# Patient Record
Sex: Female | Born: 1935
Health system: Southern US, Community
[De-identification: ages and names within clinical notes are randomized; demographics above are authoritative.]

## PROBLEM LIST (undated history)

## (undated) ENCOUNTER — Emergency Department (HOSPITAL_COMMUNITY): Payer: Medicare PPO

## (undated) DIAGNOSIS — I2699 Other pulmonary embolism without acute cor pulmonale: Secondary | ICD-10-CM

## (undated) DIAGNOSIS — K5792 Diverticulitis of intestine, part unspecified, without perforation or abscess without bleeding: Secondary | ICD-10-CM

## (undated) DIAGNOSIS — I82409 Acute embolism and thrombosis of unspecified deep veins of unspecified lower extremity: Secondary | ICD-10-CM

## (undated) DIAGNOSIS — I10 Essential (primary) hypertension: Secondary | ICD-10-CM

## (undated) HISTORY — PX: EYE SURGERY: SHX253

## (undated) HISTORY — PX: TONSILLECTOMY: SUR1361

---

## 2022-05-23 ENCOUNTER — Inpatient Hospital Stay (HOSPITAL_BASED_OUTPATIENT_CLINIC_OR_DEPARTMENT_OTHER)
Admission: EM | Admit: 2022-05-23 | Discharge: 2022-06-06 | DRG: 329 | Disposition: A | Discharge status: Skilled Nursing Facility | Payer: Medicare PPO | Attending: Internal Medicine | Admitting: Internal Medicine

## 2022-05-23 ENCOUNTER — Emergency Department (HOSPITAL_BASED_OUTPATIENT_CLINIC_OR_DEPARTMENT_OTHER): Payer: Medicare PPO | Admitting: Radiology

## 2022-05-23 ENCOUNTER — Emergency Department (HOSPITAL_BASED_OUTPATIENT_CLINIC_OR_DEPARTMENT_OTHER): Payer: Medicare PPO

## 2022-05-23 ENCOUNTER — Other Ambulatory Visit: Payer: Self-pay

## 2022-05-23 ENCOUNTER — Encounter (HOSPITAL_BASED_OUTPATIENT_CLINIC_OR_DEPARTMENT_OTHER): Payer: Self-pay | Admitting: Emergency Medicine

## 2022-05-23 DIAGNOSIS — K6389 Other specified diseases of intestine: Secondary | ICD-10-CM | POA: Diagnosis not present

## 2022-05-23 DIAGNOSIS — I10 Essential (primary) hypertension: Secondary | ICD-10-CM | POA: Diagnosis not present

## 2022-05-23 DIAGNOSIS — F0390 Unspecified dementia without behavioral disturbance: Secondary | ICD-10-CM | POA: Diagnosis not present

## 2022-05-23 DIAGNOSIS — Z96641 Presence of right artificial hip joint: Secondary | ICD-10-CM | POA: Diagnosis present

## 2022-05-23 DIAGNOSIS — I82403 Acute embolism and thrombosis of unspecified deep veins of lower extremity, bilateral: Secondary | ICD-10-CM

## 2022-05-23 DIAGNOSIS — K567 Ileus, unspecified: Secondary | ICD-10-CM | POA: Diagnosis not present

## 2022-05-23 DIAGNOSIS — K9189 Other postprocedural complications and disorders of digestive system: Secondary | ICD-10-CM | POA: Diagnosis not present

## 2022-05-23 DIAGNOSIS — D62 Acute posthemorrhagic anemia: Secondary | ICD-10-CM | POA: Diagnosis not present

## 2022-05-23 DIAGNOSIS — I2693 Single subsegmental pulmonary embolism without acute cor pulmonale: Secondary | ICD-10-CM | POA: Diagnosis present

## 2022-05-23 DIAGNOSIS — E43 Unspecified severe protein-calorie malnutrition: Secondary | ICD-10-CM | POA: Insufficient documentation

## 2022-05-23 DIAGNOSIS — K5700 Diverticulitis of small intestine with perforation and abscess without bleeding: Secondary | ICD-10-CM | POA: Diagnosis not present

## 2022-05-23 DIAGNOSIS — R4189 Other symptoms and signs involving cognitive functions and awareness: Secondary | ICD-10-CM | POA: Diagnosis present

## 2022-05-23 DIAGNOSIS — I2699 Other pulmonary embolism without acute cor pulmonale: Secondary | ICD-10-CM | POA: Diagnosis present

## 2022-05-23 DIAGNOSIS — Z79899 Other long term (current) drug therapy: Secondary | ICD-10-CM

## 2022-05-23 DIAGNOSIS — I82451 Acute embolism and thrombosis of right peroneal vein: Secondary | ICD-10-CM | POA: Diagnosis present

## 2022-05-23 DIAGNOSIS — E876 Hypokalemia: Secondary | ICD-10-CM | POA: Diagnosis present

## 2022-05-23 DIAGNOSIS — G8929 Other chronic pain: Secondary | ICD-10-CM | POA: Diagnosis present

## 2022-05-23 DIAGNOSIS — I82432 Acute embolism and thrombosis of left popliteal vein: Secondary | ICD-10-CM | POA: Diagnosis present

## 2022-05-23 DIAGNOSIS — J189 Pneumonia, unspecified organism: Secondary | ICD-10-CM | POA: Diagnosis not present

## 2022-05-23 DIAGNOSIS — Z66 Do not resuscitate: Secondary | ICD-10-CM | POA: Diagnosis not present

## 2022-05-23 DIAGNOSIS — I82462 Acute embolism and thrombosis of left calf muscular vein: Secondary | ICD-10-CM | POA: Diagnosis present

## 2022-05-23 DIAGNOSIS — R319 Hematuria, unspecified: Secondary | ICD-10-CM | POA: Diagnosis not present

## 2022-05-23 DIAGNOSIS — R1909 Other intra-abdominal and pelvic swelling, mass and lump: Secondary | ICD-10-CM | POA: Diagnosis not present

## 2022-05-23 HISTORY — DX: Diverticulitis of intestine, part unspecified, without perforation or abscess without bleeding: K57.92

## 2022-05-23 HISTORY — DX: Essential (primary) hypertension: I10

## 2022-05-23 LAB — COMPREHENSIVE METABOLIC PANEL
ALT: 16 U/L (ref 0–44)
AST: 20 U/L (ref 15–41)
Albumin: 3.2 g/dL — ABNORMAL LOW (ref 3.5–5.0)
Alkaline Phosphatase: 35 U/L — ABNORMAL LOW (ref 38–126)
Anion gap: 13 (ref 5–15)
BUN: 25 mg/dL — ABNORMAL HIGH (ref 8–23)
CO2: 25 mmol/L (ref 22–32)
Calcium: 9.2 mg/dL (ref 8.9–10.3)
Chloride: 103 mmol/L (ref 98–111)
Creatinine, Ser: 0.96 mg/dL (ref 0.44–1.00)
GFR, Estimated: 56 mL/min — ABNORMAL LOW (ref >60)
Glucose, Bld: 141 mg/dL — ABNORMAL HIGH (ref 70–99)
Potassium: 3.6 mmol/L (ref 3.5–5.1)
Sodium: 141 mmol/L (ref 135–145)
Total Bilirubin: 0.5 mg/dL (ref 0.3–1.2)
Total Protein: 5.8 g/dL — ABNORMAL LOW (ref 6.5–8.1)

## 2022-05-23 LAB — CBC WITH DIFFERENTIAL/PLATELET
Abs Immature Granulocytes: 0.04 10*3/uL (ref 0.00–0.07)
Basophils Absolute: 0 10*3/uL (ref 0.0–0.1)
Basophils Relative: 0 %
Eosinophils Absolute: 0.1 10*3/uL (ref 0.0–0.5)
Eosinophils Relative: 1 %
HCT: 41.2 % (ref 36.0–46.0)
Hemoglobin: 13.4 g/dL (ref 12.0–15.0)
Immature Granulocytes: 0 %
Lymphocytes Relative: 14 %
Lymphs Abs: 1.6 10*3/uL (ref 0.7–4.0)
MCH: 30.5 pg (ref 26.0–34.0)
MCHC: 32.5 g/dL (ref 30.0–36.0)
MCV: 93.6 fL (ref 80.0–100.0)
Monocytes Absolute: 1 10*3/uL (ref 0.1–1.0)
Monocytes Relative: 9 %
Neutro Abs: 8.6 10*3/uL — ABNORMAL HIGH (ref 1.7–7.7)
Neutrophils Relative %: 76 %
Platelets: 270 10*3/uL (ref 150–400)
RBC: 4.4 MIL/uL (ref 3.87–5.11)
RDW: 13.5 % (ref 11.5–15.5)
WBC: 11.3 10*3/uL — ABNORMAL HIGH (ref 4.0–10.5)
nRBC: 0 % (ref 0.0–0.2)

## 2022-05-23 LAB — URINALYSIS, ROUTINE W REFLEX MICROSCOPIC
Bilirubin Urine: NEGATIVE
Glucose, UA: NEGATIVE mg/dL
Hgb urine dipstick: NEGATIVE
Nitrite: NEGATIVE
Protein, ur: 30 mg/dL — AB
Specific Gravity, Urine: 1.023 (ref 1.005–1.030)
pH: 6 (ref 5.0–8.0)

## 2022-05-23 LAB — LACTIC ACID, PLASMA: Lactic Acid, Venous: 1.8 mmol/L (ref 0.5–1.9)

## 2022-05-23 LAB — TROPONIN I (HIGH SENSITIVITY)
Troponin I (High Sensitivity): 10 ng/L (ref <18)
Troponin I (High Sensitivity): 10 ng/L (ref <18)

## 2022-05-23 LAB — PROTIME-INR
INR: 1.3 — ABNORMAL HIGH (ref 0.8–1.2)
Prothrombin Time: 15.6 seconds — ABNORMAL HIGH (ref 11.4–15.2)

## 2022-05-23 MED ORDER — LIDOCAINE VISCOUS HCL 2 % MT SOLN
15.0000 mL | Freq: Once | OROMUCOSAL | Status: AC
  Administered 2022-05-23: 15 mL via ORAL
  Filled 2022-05-23: qty 15

## 2022-05-23 MED ORDER — IOHEXOL 350 MG/ML SOLN
100.0000 mL | Freq: Once | INTRAVENOUS | Status: AC | PRN
  Administered 2022-05-23: 75 mL via INTRAVENOUS

## 2022-05-23 MED ORDER — ALUM & MAG HYDROXIDE-SIMETH 200-200-20 MG/5ML PO SUSP
30.0000 mL | Freq: Once | ORAL | Status: AC
  Administered 2022-05-23: 30 mL via ORAL
  Filled 2022-05-23: qty 30

## 2022-05-23 MED ORDER — HEPARIN BOLUS VIA INFUSION
3000.0000 [IU] | Freq: Once | INTRAVENOUS | Status: AC
Start: 2022-05-23 — End: 2022-05-23
  Administered 2022-05-23: 3000 [IU] via INTRAVENOUS

## 2022-05-23 MED ORDER — HEPARIN (PORCINE) 25000 UT/250ML-% IV SOLN
800.0000 [IU]/h | INTRAVENOUS | Status: DC
  Administered 2022-05-23: 1000 [IU]/h via INTRAVENOUS
  Administered 2022-05-24: 850 [IU]/h via INTRAVENOUS
  Filled 2022-05-23 (×2): qty 250

## 2022-05-23 MED ORDER — FAMOTIDINE IN NACL 20-0.9 MG/50ML-% IV SOLN
20.0000 mg | Freq: Once | INTRAVENOUS | Status: AC
  Administered 2022-05-23: 20 mg via INTRAVENOUS
  Filled 2022-05-23: qty 50

## 2022-05-24 ENCOUNTER — Encounter (HOSPITAL_COMMUNITY): Payer: Self-pay | Admitting: Family Medicine

## 2022-05-24 ENCOUNTER — Inpatient Hospital Stay (HOSPITAL_COMMUNITY): Payer: Medicare PPO

## 2022-05-24 ENCOUNTER — Other Ambulatory Visit (HOSPITAL_COMMUNITY): Payer: Self-pay

## 2022-05-24 DIAGNOSIS — K567 Ileus, unspecified: Secondary | ICD-10-CM | POA: Diagnosis not present

## 2022-05-24 DIAGNOSIS — R1909 Other intra-abdominal and pelvic swelling, mass and lump: Secondary | ICD-10-CM | POA: Diagnosis present

## 2022-05-24 DIAGNOSIS — K5700 Diverticulitis of small intestine with perforation and abscess without bleeding: Secondary | ICD-10-CM | POA: Diagnosis present

## 2022-05-24 DIAGNOSIS — Z79899 Other long term (current) drug therapy: Secondary | ICD-10-CM | POA: Diagnosis not present

## 2022-05-24 DIAGNOSIS — E43 Unspecified severe protein-calorie malnutrition: Secondary | ICD-10-CM | POA: Diagnosis present

## 2022-05-24 DIAGNOSIS — Z96641 Presence of right artificial hip joint: Secondary | ICD-10-CM | POA: Diagnosis present

## 2022-05-24 DIAGNOSIS — I10 Essential (primary) hypertension: Secondary | ICD-10-CM | POA: Diagnosis present

## 2022-05-24 DIAGNOSIS — I82462 Acute embolism and thrombosis of left calf muscular vein: Secondary | ICD-10-CM | POA: Diagnosis present

## 2022-05-24 DIAGNOSIS — Z66 Do not resuscitate: Secondary | ICD-10-CM | POA: Diagnosis not present

## 2022-05-24 DIAGNOSIS — F0390 Unspecified dementia without behavioral disturbance: Secondary | ICD-10-CM | POA: Diagnosis present

## 2022-05-24 DIAGNOSIS — E876 Hypokalemia: Secondary | ICD-10-CM | POA: Diagnosis present

## 2022-05-24 DIAGNOSIS — I2699 Other pulmonary embolism without acute cor pulmonale: Secondary | ICD-10-CM | POA: Diagnosis not present

## 2022-05-24 DIAGNOSIS — K6389 Other specified diseases of intestine: Secondary | ICD-10-CM | POA: Diagnosis present

## 2022-05-24 DIAGNOSIS — Z515 Encounter for palliative care: Secondary | ICD-10-CM | POA: Diagnosis not present

## 2022-05-24 DIAGNOSIS — D649 Anemia, unspecified: Secondary | ICD-10-CM | POA: Diagnosis not present

## 2022-05-24 DIAGNOSIS — I82432 Acute embolism and thrombosis of left popliteal vein: Secondary | ICD-10-CM | POA: Diagnosis present

## 2022-05-24 DIAGNOSIS — R319 Hematuria, unspecified: Secondary | ICD-10-CM | POA: Diagnosis not present

## 2022-05-24 DIAGNOSIS — I2693 Single subsegmental pulmonary embolism without acute cor pulmonale: Secondary | ICD-10-CM | POA: Diagnosis present

## 2022-05-24 DIAGNOSIS — K9189 Other postprocedural complications and disorders of digestive system: Secondary | ICD-10-CM | POA: Diagnosis not present

## 2022-05-24 DIAGNOSIS — I82403 Acute embolism and thrombosis of unspecified deep veins of lower extremity, bilateral: Secondary | ICD-10-CM

## 2022-05-24 DIAGNOSIS — D62 Acute posthemorrhagic anemia: Secondary | ICD-10-CM | POA: Diagnosis not present

## 2022-05-24 DIAGNOSIS — I82451 Acute embolism and thrombosis of right peroneal vein: Secondary | ICD-10-CM | POA: Diagnosis present

## 2022-05-24 DIAGNOSIS — R4189 Other symptoms and signs involving cognitive functions and awareness: Secondary | ICD-10-CM | POA: Diagnosis present

## 2022-05-24 DIAGNOSIS — J189 Pneumonia, unspecified organism: Secondary | ICD-10-CM | POA: Diagnosis not present

## 2022-05-24 DIAGNOSIS — G8929 Other chronic pain: Secondary | ICD-10-CM | POA: Diagnosis present

## 2022-05-24 DIAGNOSIS — Z7189 Other specified counseling: Secondary | ICD-10-CM | POA: Diagnosis not present

## 2022-05-24 LAB — BASIC METABOLIC PANEL
Anion gap: 10 (ref 5–15)
BUN: 18 mg/dL (ref 8–23)
CO2: 24 mmol/L (ref 22–32)
Calcium: 8.3 mg/dL — ABNORMAL LOW (ref 8.9–10.3)
Chloride: 105 mmol/L (ref 98–111)
Creatinine, Ser: 0.93 mg/dL (ref 0.44–1.00)
GFR, Estimated: 58 mL/min — ABNORMAL LOW (ref >60)
Glucose, Bld: 98 mg/dL (ref 70–99)
Potassium: 3.3 mmol/L — ABNORMAL LOW (ref 3.5–5.1)
Sodium: 139 mmol/L (ref 135–145)

## 2022-05-24 LAB — MAGNESIUM: Magnesium: 2 mg/dL (ref 1.7–2.4)

## 2022-05-24 LAB — CBC
HCT: 34.8 % — ABNORMAL LOW (ref 36.0–46.0)
Hemoglobin: 11.6 g/dL — ABNORMAL LOW (ref 12.0–15.0)
MCH: 31.3 pg (ref 26.0–34.0)
MCHC: 33.3 g/dL (ref 30.0–36.0)
MCV: 93.8 fL (ref 80.0–100.0)
Platelets: 228 10*3/uL (ref 150–400)
RBC: 3.71 MIL/uL — ABNORMAL LOW (ref 3.87–5.11)
RDW: 13.5 % (ref 11.5–15.5)
WBC: 10.2 10*3/uL (ref 4.0–10.5)
nRBC: 0 % (ref 0.0–0.2)

## 2022-05-24 LAB — ECHOCARDIOGRAM COMPLETE
AR max vel: 2.78 cm2
AV Area VTI: 3.06 cm2
AV Area mean vel: 2.43 cm2
AV Mean grad: 2 mmHg
AV Peak grad: 5.1 mmHg
Ao pk vel: 1.13 m/s
Area-P 1/2: 2.61 cm2
Height: 63.5 in
S' Lateral: 3.6 cm
Weight: 2048 oz

## 2022-05-24 LAB — HEPARIN LEVEL (UNFRACTIONATED)
Heparin Unfractionated: 0.68 IU/mL (ref 0.30–0.70)
Heparin Unfractionated: 0.85 IU/mL — ABNORMAL HIGH (ref 0.30–0.70)
Heparin Unfractionated: 0.7 IU/mL (ref 0.30–0.70)

## 2022-05-24 LAB — BRAIN NATRIURETIC PEPTIDE: B Natriuretic Peptide: 190.6 pg/mL — ABNORMAL HIGH (ref 0.0–100.0)

## 2022-05-24 LAB — LACTIC ACID, PLASMA: Lactic Acid, Venous: 1.2 mmol/L (ref 0.5–1.9)

## 2022-05-24 MED ORDER — POTASSIUM CHLORIDE CRYS ER 10 MEQ PO TBCR
40.0000 meq | EXTENDED_RELEASE_TABLET | Freq: Once | ORAL | Status: AC
  Administered 2022-05-24: 40 meq via ORAL
  Filled 2022-05-24: qty 4

## 2022-05-24 MED ORDER — PANTOPRAZOLE SODIUM 40 MG PO TBEC: 40.0000 mg | DELAYED_RELEASE_TABLET | Freq: Every day | ORAL | Status: DC

## 2022-05-24 MED ORDER — LORATADINE 10 MG PO TABS: 10.0000 mg | ORAL_TABLET | Freq: Every day | ORAL | Status: DC

## 2022-05-24 MED ORDER — SACCHAROMYCES BOULARDII 250 MG PO CAPS
250.0000 mg | ORAL_CAPSULE | Freq: Two times a day (BID) | ORAL | Status: DC
  Administered 2022-05-24: 250 mg via ORAL
  Filled 2022-05-24: qty 1

## 2022-05-24 MED ORDER — COENZYME Q10 30 MG PO CAPS: 30.0000 mg | ORAL_CAPSULE | Freq: Every day | ORAL | Status: DC

## 2022-05-24 MED ORDER — FENTANYL CITRATE PF 50 MCG/ML IJ SOSY
12.5000 ug | PREFILLED_SYRINGE | INTRAMUSCULAR | Status: DC | PRN
  Administered 2022-05-24 – 2022-05-25 (×2): 12.5 ug via INTRAVENOUS
  Filled 2022-05-24 (×2): qty 1

## 2022-05-24 MED ORDER — ACETAMINOPHEN 325 MG PO TABS
650.0000 mg | ORAL_TABLET | Freq: Four times a day (QID) | ORAL | Status: DC | PRN
Start: 2022-05-24 — End: 2022-05-25

## 2022-05-24 MED ORDER — SODIUM CHLORIDE 0.9% FLUSH
3.0000 mL | Freq: Two times a day (BID) | INTRAVENOUS | Status: DC
  Administered 2022-05-25 – 2022-06-06 (×18): 3 mL via INTRAVENOUS

## 2022-05-24 MED ORDER — ATENOLOL 25 MG PO TABS
25.0000 mg | ORAL_TABLET | Freq: Every day | ORAL | Status: DC
  Administered 2022-05-24 – 2022-05-25 (×2): 25 mg via ORAL
  Filled 2022-05-24 (×2): qty 1

## 2022-05-24 MED ORDER — HYDRALAZINE HCL 25 MG PO TABS: 25.0000 mg | ORAL_TABLET | Freq: Four times a day (QID) | ORAL | Status: DC | PRN

## 2022-05-24 MED ORDER — PANTOPRAZOLE SODIUM 40 MG PO TBEC
40.0000 mg | DELAYED_RELEASE_TABLET | Freq: Every day | ORAL | Status: DC
  Administered 2022-05-24 – 2022-05-25 (×2): 40 mg via ORAL
  Filled 2022-05-24 (×2): qty 1

## 2022-05-24 MED ORDER — MELATONIN 3 MG PO TABS
3.0000 mg | ORAL_TABLET | Freq: Every evening | ORAL | Status: DC | PRN
  Administered 2022-05-24: 3 mg via ORAL
  Filled 2022-05-24: qty 1

## 2022-05-24 MED ORDER — IPRATROPIUM BROMIDE 0.06 % NA SOLN
2.0000 | Freq: Two times a day (BID) | NASAL | Status: DC | PRN
Start: 2022-05-24 — End: 2022-06-06

## 2022-05-24 MED ORDER — SODIUM CHLORIDE 0.9 % IV SOLN: INTRAVENOUS | Status: AC

## 2022-05-24 MED ORDER — PIPERACILLIN-TAZOBACTAM 3.375 G IVPB
3.3750 g | Freq: Three times a day (TID) | INTRAVENOUS | Status: DC
  Administered 2022-05-24 – 2022-05-25 (×5): 3.375 g via INTRAVENOUS
  Filled 2022-05-24 (×5): qty 50

## 2022-05-24 MED ORDER — OMEGA-3-ACID ETHYL ESTERS 1 G PO CAPS: 1.0000 | ORAL_CAPSULE | Freq: Every day | ORAL | Status: DC

## 2022-05-24 MED ORDER — ALUM & MAG HYDROXIDE-SIMETH 200-200-20 MG/5ML PO SUSP
30.0000 mL | Freq: Four times a day (QID) | ORAL | Status: DC | PRN
  Administered 2022-05-24 – 2022-05-25 (×2): 30 mL via ORAL
  Filled 2022-05-24 (×2): qty 30

## 2022-05-24 MED ORDER — FAMOTIDINE 20 MG PO TABS: 20.0000 mg | ORAL_TABLET | Freq: Every day | ORAL | Status: DC

## 2022-05-24 MED ORDER — LIDOCAINE VISCOUS HCL 2 % MT SOLN
15.0000 mL | Freq: Four times a day (QID) | OROMUCOSAL | Status: DC | PRN
  Administered 2022-05-24: 15 mL via ORAL
  Filled 2022-05-24 (×2): qty 15

## 2022-05-24 MED ORDER — FAMOTIDINE 20 MG PO TABS: 20.0000 mg | ORAL_TABLET | Freq: Two times a day (BID) | ORAL | Status: DC

## 2022-05-25 ENCOUNTER — Encounter (HOSPITAL_COMMUNITY)
Admission: EM | Disposition: A | Discharge status: Skilled Nursing Facility | Payer: Self-pay | Source: Home / Self Care | Attending: Family Medicine

## 2022-05-25 ENCOUNTER — Inpatient Hospital Stay (HOSPITAL_COMMUNITY): Payer: Medicare PPO | Admitting: Certified Registered"

## 2022-05-25 ENCOUNTER — Other Ambulatory Visit: Payer: Self-pay

## 2022-05-25 ENCOUNTER — Encounter (HOSPITAL_COMMUNITY): Payer: Self-pay | Admitting: Family Medicine

## 2022-05-25 DIAGNOSIS — Z7189 Other specified counseling: Secondary | ICD-10-CM

## 2022-05-25 DIAGNOSIS — Z515 Encounter for palliative care: Secondary | ICD-10-CM

## 2022-05-25 DIAGNOSIS — K5700 Diverticulitis of small intestine with perforation and abscess without bleeding: Secondary | ICD-10-CM

## 2022-05-25 DIAGNOSIS — D649 Anemia, unspecified: Secondary | ICD-10-CM

## 2022-05-25 DIAGNOSIS — I10 Essential (primary) hypertension: Secondary | ICD-10-CM

## 2022-05-25 DIAGNOSIS — F0390 Unspecified dementia without behavioral disturbance: Secondary | ICD-10-CM

## 2022-05-25 HISTORY — PX: LAPAROTOMY: SHX154

## 2022-05-25 HISTORY — PX: BOWEL RESECTION: SHX1257

## 2022-05-25 LAB — ABO/RH: ABO/RH(D): A POS

## 2022-05-25 LAB — BASIC METABOLIC PANEL
Anion gap: 8 (ref 5–15)
BUN: 11 mg/dL (ref 8–23)
CO2: 24 mmol/L (ref 22–32)
Calcium: 8.3 mg/dL — ABNORMAL LOW (ref 8.9–10.3)
Chloride: 107 mmol/L (ref 98–111)
Creatinine, Ser: 1.08 mg/dL — ABNORMAL HIGH (ref 0.44–1.00)
GFR, Estimated: 49 mL/min — ABNORMAL LOW (ref >60)
Glucose, Bld: 100 mg/dL — ABNORMAL HIGH (ref 70–99)
Potassium: 3.5 mmol/L (ref 3.5–5.1)
Sodium: 139 mmol/L (ref 135–145)

## 2022-05-25 LAB — TYPE AND SCREEN
ABO/RH(D): A POS
Antibody Screen: NEGATIVE

## 2022-05-25 LAB — CBC
HCT: 34.4 % — ABNORMAL LOW (ref 36.0–46.0)
Hemoglobin: 11.8 g/dL — ABNORMAL LOW (ref 12.0–15.0)
MCH: 31.6 pg (ref 26.0–34.0)
MCHC: 34.3 g/dL (ref 30.0–36.0)
MCV: 92.2 fL (ref 80.0–100.0)
Platelets: 229 10*3/uL (ref 150–400)
RBC: 3.73 MIL/uL — ABNORMAL LOW (ref 3.87–5.11)
RDW: 13.5 % (ref 11.5–15.5)
WBC: 9.6 10*3/uL (ref 4.0–10.5)
nRBC: 0 % (ref 0.0–0.2)

## 2022-05-25 LAB — HEPARIN LEVEL (UNFRACTIONATED): Heparin Unfractionated: 0.51 IU/mL (ref 0.30–0.70)

## 2022-05-25 LAB — SURGICAL PCR SCREEN
MRSA, PCR: NEGATIVE
Staphylococcus aureus: NEGATIVE

## 2022-05-25 SURGERY — LAPAROTOMY, EXPLORATORY
Anesthesia: General | Site: Abdomen

## 2022-05-25 MED ORDER — ALUM & MAG HYDROXIDE-SIMETH 200-200-20 MG/5ML PO SUSP
30.0000 mL | Freq: Four times a day (QID) | ORAL | Status: DC | PRN
  Administered 2022-06-05 – 2022-06-06 (×2): 30 mL
  Filled 2022-05-25 (×2): qty 30

## 2022-05-25 MED ORDER — BUPIVACAINE LIPOSOME 1.3 % IJ SUSP
INTRAMUSCULAR | Status: AC
  Filled 2022-05-25: qty 20

## 2022-05-25 MED ORDER — LACTATED RINGERS IV SOLN: INTRAVENOUS | Status: DC

## 2022-05-25 MED ORDER — ACETAMINOPHEN 10 MG/ML IV SOLN
1000.0000 mg | Freq: Four times a day (QID) | INTRAVENOUS | Status: AC
  Administered 2022-05-25 – 2022-05-26 (×2): 1000 mg via INTRAVENOUS
  Filled 2022-05-25 (×3): qty 100

## 2022-05-25 MED ORDER — ACETAMINOPHEN 10 MG/ML IV SOLN
INTRAVENOUS | Status: AC
  Administered 2022-05-25: 1000 mg via INTRAVENOUS
  Filled 2022-05-25: qty 100

## 2022-05-25 MED ORDER — PROPOFOL 10 MG/ML IV BOLUS
INTRAVENOUS | Status: DC | PRN
  Administered 2022-05-25: 80 mg via INTRAVENOUS

## 2022-05-25 MED ORDER — PIPERACILLIN-TAZOBACTAM 3.375 G IVPB
3.3750 g | Freq: Three times a day (TID) | INTRAVENOUS | Status: AC
  Administered 2022-05-25 – 2022-05-26 (×2): 3.375 g via INTRAVENOUS
  Filled 2022-05-25 (×2): qty 50

## 2022-05-25 MED ORDER — ALBUMIN HUMAN 5 % IV SOLN: 12.5000 g | Freq: Once | INTRAVENOUS | Status: AC

## 2022-05-25 MED ORDER — HYDRALAZINE HCL 20 MG/ML IJ SOLN
INTRAMUSCULAR | Status: DC | PRN
  Administered 2022-05-25: 5 mg via INTRAVENOUS

## 2022-05-25 MED ORDER — FENTANYL CITRATE (PF) 250 MCG/5ML IJ SOLN
INTRAMUSCULAR | Status: DC | PRN
  Administered 2022-05-25: 100 ug via INTRAVENOUS
  Administered 2022-05-25: 25 ug via INTRAVENOUS
  Administered 2022-05-25: 50 ug via INTRAVENOUS

## 2022-05-25 MED ORDER — ORAL CARE MOUTH RINSE: 15.0000 mL | Freq: Once | OROMUCOSAL | Status: AC

## 2022-05-25 MED ORDER — ROCURONIUM BROMIDE 10 MG/ML (PF) SYRINGE
PREFILLED_SYRINGE | INTRAVENOUS | Status: DC | PRN
  Administered 2022-05-25: 100 mg via INTRAVENOUS

## 2022-05-25 MED ORDER — OMEGA-3-ACID ETHYL ESTERS 1 G PO CAPS
1.0000 | ORAL_CAPSULE | Freq: Every day | ORAL | Status: DC
  Administered 2022-05-28: 1 g
  Filled 2022-05-25 (×5): qty 1

## 2022-05-25 MED ORDER — FAMOTIDINE 20 MG PO TABS
20.0000 mg | ORAL_TABLET | Freq: Every day | ORAL | Status: DC
Start: 2022-05-26 — End: 2022-05-30
  Administered 2022-05-26 – 2022-05-29 (×4): 20 mg
  Filled 2022-05-25 (×5): qty 1

## 2022-05-25 MED ORDER — AMISULPRIDE (ANTIEMETIC) 5 MG/2ML IV SOLN: 10.0000 mg | Freq: Once | INTRAVENOUS | Status: DC | PRN

## 2022-05-25 MED ORDER — ATENOLOL 25 MG PO TABS
25.0000 mg | ORAL_TABLET | Freq: Every day | ORAL | Status: DC
  Administered 2022-05-26 – 2022-05-29 (×4): 25 mg
  Filled 2022-05-25 (×4): qty 1

## 2022-05-25 MED ORDER — LACTATED RINGERS IV SOLN: INTRAVENOUS | Status: DC | PRN

## 2022-05-25 MED ORDER — FENTANYL CITRATE (PF) 250 MCG/5ML IJ SOLN
INTRAMUSCULAR | Status: AC
  Filled 2022-05-25: qty 5

## 2022-05-25 MED ORDER — BUPIVACAINE LIPOSOME 1.3 % IJ SUSP
INTRAMUSCULAR | Status: DC | PRN
  Administered 2022-05-25: 20 mL

## 2022-05-25 MED ORDER — FENTANYL CITRATE (PF) 100 MCG/2ML IJ SOLN
INTRAMUSCULAR | Status: AC
  Administered 2022-05-25: 25 ug via INTRAVENOUS
  Filled 2022-05-25: qty 2

## 2022-05-25 MED ORDER — FENTANYL CITRATE PF 50 MCG/ML IJ SOSY
12.5000 ug | PREFILLED_SYRINGE | INTRAMUSCULAR | Status: DC | PRN
  Administered 2022-05-25 – 2022-05-26 (×2): 12.5 ug via INTRAVENOUS
  Filled 2022-05-25 (×2): qty 1

## 2022-05-25 MED ORDER — HEPARIN (PORCINE) 25000 UT/250ML-% IV SOLN
800.0000 [IU]/h | INTRAVENOUS | Status: DC
  Administered 2022-05-26 – 2022-05-28 (×3): 800 [IU]/h via INTRAVENOUS
  Filled 2022-05-25 (×4): qty 250

## 2022-05-25 MED ORDER — ALBUMIN HUMAN 5 % IV SOLN
INTRAVENOUS | Status: AC
  Administered 2022-05-25: 12.5 g via INTRAVENOUS
  Filled 2022-05-25: qty 250

## 2022-05-25 MED ORDER — OXYCODONE HCL 5 MG/5ML PO SOLN: 5.0000 mg | Freq: Once | ORAL | Status: DC | PRN

## 2022-05-25 MED ORDER — ONDANSETRON HCL 4 MG/2ML IJ SOLN: 4.0000 mg | Freq: Once | INTRAMUSCULAR | Status: DC | PRN

## 2022-05-25 MED ORDER — LIDOCAINE VISCOUS HCL 2 % MT SOLN: 15.0000 mL | Freq: Four times a day (QID) | OROMUCOSAL | Status: DC | PRN

## 2022-05-25 MED ORDER — PROPOFOL 10 MG/ML IV BOLUS
INTRAVENOUS | Status: AC
  Filled 2022-05-25: qty 20

## 2022-05-25 MED ORDER — FENTANYL CITRATE (PF) 100 MCG/2ML IJ SOLN
25.0000 ug | INTRAMUSCULAR | Status: DC | PRN
  Administered 2022-05-25 (×3): 25 ug via INTRAVENOUS

## 2022-05-25 MED ORDER — PHENYLEPHRINE HCL-NACL 20-0.9 MG/250ML-% IV SOLN
INTRAVENOUS | Status: DC | PRN
  Administered 2022-05-25: 15 ug/min via INTRAVENOUS

## 2022-05-25 MED ORDER — 0.9 % SODIUM CHLORIDE (POUR BTL) OPTIME
TOPICAL | Status: DC | PRN
  Administered 2022-05-25: 1000 mL

## 2022-05-25 MED ORDER — OXYCODONE HCL 5 MG PO TABS: 5.0000 mg | ORAL_TABLET | Freq: Once | ORAL | Status: DC | PRN

## 2022-05-25 MED ORDER — CHLORHEXIDINE GLUCONATE 0.12 % MT SOLN: 15.0000 mL | Freq: Once | OROMUCOSAL | Status: AC

## 2022-05-25 MED ORDER — ONDANSETRON HCL 4 MG/2ML IJ SOLN
INTRAMUSCULAR | Status: AC
  Filled 2022-05-25: qty 2

## 2022-05-25 MED ORDER — PANTOPRAZOLE SODIUM 40 MG IV SOLR
40.0000 mg | Freq: Every day | INTRAVENOUS | Status: DC
  Administered 2022-05-26 – 2022-05-30 (×5): 40 mg via INTRAVENOUS
  Filled 2022-05-25 (×6): qty 10

## 2022-05-25 MED ORDER — HYDRALAZINE HCL 25 MG PO TABS: 25.0000 mg | ORAL_TABLET | Freq: Four times a day (QID) | ORAL | Status: DC | PRN

## 2022-05-25 MED ORDER — SUGAMMADEX SODIUM 200 MG/2ML IV SOLN
INTRAVENOUS | Status: DC | PRN
  Administered 2022-05-25: 200 mg via INTRAVENOUS

## 2022-05-25 MED ORDER — LIDOCAINE 2% (20 MG/ML) 5 ML SYRINGE
INTRAMUSCULAR | Status: DC | PRN
  Administered 2022-05-25: 60 mg via INTRAVENOUS

## 2022-05-25 MED ORDER — DEXAMETHASONE SODIUM PHOSPHATE 10 MG/ML IJ SOLN
INTRAMUSCULAR | Status: AC
  Filled 2022-05-25: qty 1

## 2022-05-25 MED ORDER — CHLORHEXIDINE GLUCONATE 0.12 % MT SOLN
OROMUCOSAL | Status: AC
  Administered 2022-05-25: 15 mL via OROMUCOSAL
  Filled 2022-05-25: qty 15

## 2022-05-25 MED ORDER — DEXAMETHASONE SODIUM PHOSPHATE 10 MG/ML IJ SOLN
INTRAMUSCULAR | Status: DC | PRN
  Administered 2022-05-25: 10 mg via INTRAVENOUS

## 2022-05-25 SURGICAL SUPPLY — 53 items
CANISTER SUCT 3000ML PPV (MISCELLANEOUS) ×2 IMPLANT
CELLS DAT CNTRL 66122 CELL SVR (MISCELLANEOUS) ×1 IMPLANT
CHLORAPREP W/TINT 26 (MISCELLANEOUS) ×2 IMPLANT
COVER SURGICAL LIGHT HANDLE (MISCELLANEOUS) ×2 IMPLANT
DRAPE LAPAROSCOPIC ABDOMINAL (DRAPES) ×2 IMPLANT
DRAPE WARM FLUID 44X44 (DRAPES) ×2 IMPLANT
DRSG OPSITE POSTOP 4X10 (GAUZE/BANDAGES/DRESSINGS) IMPLANT
DRSG OPSITE POSTOP 4X6 (GAUZE/BANDAGES/DRESSINGS) ×1 IMPLANT
DRSG OPSITE POSTOP 4X8 (GAUZE/BANDAGES/DRESSINGS) ×1 IMPLANT
ELECT BLADE 6.5 EXT (BLADE) ×1 IMPLANT
ELECT CAUTERY BLADE 6.4 (BLADE) ×2 IMPLANT
ELECT REM PT RETURN 9FT ADLT (ELECTROSURGICAL) ×2
ELECTRODE REM PT RTRN 9FT ADLT (ELECTROSURGICAL) ×1 IMPLANT
GLOVE BIO SURGEON STRL SZ 6.5 (GLOVE) ×1 IMPLANT
GLOVE BIOGEL M STRL SZ7.5 (GLOVE) ×2 IMPLANT
GLOVE INDICATOR 8.0 STRL GRN (GLOVE) ×4 IMPLANT
GLOVE SURG PR MICRO ENCORE 7 (GLOVE) ×1 IMPLANT
GOWN STRL REUS W/ TWL LRG LVL3 (GOWN DISPOSABLE) ×1 IMPLANT
GOWN STRL REUS W/TWL 2XL LVL3 (GOWN DISPOSABLE) ×2 IMPLANT
GOWN STRL REUS W/TWL LRG LVL3 (GOWN DISPOSABLE) ×2
HANDLE SUCTION POOLE (INSTRUMENTS) ×1 IMPLANT
KIT BASIN OR (CUSTOM PROCEDURE TRAY) ×2 IMPLANT
KIT TURNOVER KIT B (KITS) ×2 IMPLANT
LIGASURE IMPACT 36 18CM CVD LR (INSTRUMENTS) ×1 IMPLANT
NDL BEVEL SYR 25X1 (NEEDLE) IMPLANT
NEEDLE 22X1 1/2 (OR ONLY) (NEEDLE) ×1 IMPLANT
NEEDLE BEVEL SYR 25X1 (NEEDLE) ×2 IMPLANT
NS IRRIG 1000ML POUR BTL (IV SOLUTION) ×4 IMPLANT
PACK GENERAL/GYN (CUSTOM PROCEDURE TRAY) ×2 IMPLANT
PAD ARMBOARD 7.5X6 YLW CONV (MISCELLANEOUS) ×2 IMPLANT
PENCIL SMOKE EVACUATOR (MISCELLANEOUS) ×2 IMPLANT
RELOAD STAPLE 60 2.6 WHT THN (STAPLE) IMPLANT
RELOAD STAPLE 60 3.6 BLU REG (STAPLE) IMPLANT
RELOAD STAPLER BLUE 60MM (STAPLE) ×1 IMPLANT
RELOAD STAPLER WHITE 60MM (STAPLE) ×4 IMPLANT
RETRACTOR WND ALEXIS 18 MED (MISCELLANEOUS) IMPLANT
RTRCTR WOUND ALEXIS 18CM MED (MISCELLANEOUS) ×2
SPONGE T-LAP 18X18 ~~LOC~~+RFID (SPONGE) IMPLANT
STAPLE ECHEON FLEX 60 POW ENDO (STAPLE) ×1 IMPLANT
STAPLER RELOAD BLUE 60MM (STAPLE) ×2
STAPLER RELOAD WHITE 60MM (STAPLE) ×8
STAPLER VISISTAT 35W (STAPLE) ×2 IMPLANT
SUCTION POOLE HANDLE (INSTRUMENTS) ×2
SUT PDS AB 1 TP1 96 (SUTURE) ×4 IMPLANT
SUT SILK 2 0 SH CR/8 (SUTURE) ×2 IMPLANT
SUT SILK 2 0 TIES 10X30 (SUTURE) ×2 IMPLANT
SUT SILK 3 0 SH CR/8 (SUTURE) ×2 IMPLANT
SUT SILK 3 0 TIES 10X30 (SUTURE) ×2 IMPLANT
SUT VIC AB 3-0 SH 18 (SUTURE) IMPLANT
SYR CONTROL 10ML LL (SYRINGE) ×1 IMPLANT
TOWEL GREEN STERILE (TOWEL DISPOSABLE) ×2 IMPLANT
TRAY FOLEY MTR SLVR 16FR STAT (SET/KITS/TRAYS/PACK) ×1 IMPLANT
YANKAUER SUCT BULB TIP NO VENT (SUCTIONS) IMPLANT

## 2022-05-26 ENCOUNTER — Encounter (HOSPITAL_COMMUNITY): Payer: Self-pay | Admitting: General Surgery

## 2022-05-26 LAB — BASIC METABOLIC PANEL
Anion gap: 9 (ref 5–15)
BUN: 8 mg/dL (ref 8–23)
CO2: 22 mmol/L (ref 22–32)
Calcium: 8.1 mg/dL — ABNORMAL LOW (ref 8.9–10.3)
Chloride: 106 mmol/L (ref 98–111)
Creatinine, Ser: 0.97 mg/dL (ref 0.44–1.00)
GFR, Estimated: 56 mL/min — ABNORMAL LOW (ref >60)
Glucose, Bld: 97 mg/dL (ref 70–99)
Potassium: 3.3 mmol/L — ABNORMAL LOW (ref 3.5–5.1)
Sodium: 137 mmol/L (ref 135–145)

## 2022-05-26 LAB — CBC
HCT: 32.4 % — ABNORMAL LOW (ref 36.0–46.0)
Hemoglobin: 10.9 g/dL — ABNORMAL LOW (ref 12.0–15.0)
MCH: 31.6 pg (ref 26.0–34.0)
MCHC: 33.6 g/dL (ref 30.0–36.0)
MCV: 93.9 fL (ref 80.0–100.0)
Platelets: 215 10*3/uL (ref 150–400)
RBC: 3.45 MIL/uL — ABNORMAL LOW (ref 3.87–5.11)
RDW: 13.5 % (ref 11.5–15.5)
WBC: 9.9 10*3/uL (ref 4.0–10.5)
nRBC: 0 % (ref 0.0–0.2)

## 2022-05-26 LAB — HEPARIN LEVEL (UNFRACTIONATED): Heparin Unfractionated: 0.37 IU/mL (ref 0.30–0.70)

## 2022-05-26 MED ORDER — CHLORHEXIDINE GLUCONATE CLOTH 2 % EX PADS
6.0000 | MEDICATED_PAD | Freq: Every day | CUTANEOUS | Status: DC
  Administered 2022-05-27 – 2022-06-06 (×5): 6 via TOPICAL

## 2022-05-26 MED ORDER — OXYCODONE-ACETAMINOPHEN 5-325 MG PO TABS
1.0000 | ORAL_TABLET | Freq: Four times a day (QID) | ORAL | Status: AC
  Administered 2022-05-26 – 2022-05-29 (×12): 1 via ORAL
  Filled 2022-05-26 (×12): qty 1

## 2022-05-26 MED ORDER — MORPHINE SULFATE (PF) 2 MG/ML IV SOLN
1.0000 mg | INTRAVENOUS | Status: DC | PRN
  Administered 2022-05-26 – 2022-05-29 (×5): 1 mg via INTRAVENOUS
  Filled 2022-05-26 (×6): qty 1

## 2022-05-27 LAB — BASIC METABOLIC PANEL
Anion gap: 8 (ref 5–15)
BUN: 18 mg/dL (ref 8–23)
CO2: 23 mmol/L (ref 22–32)
Calcium: 8.2 mg/dL — ABNORMAL LOW (ref 8.9–10.3)
Chloride: 105 mmol/L (ref 98–111)
Creatinine, Ser: 1.04 mg/dL — ABNORMAL HIGH (ref 0.44–1.00)
GFR, Estimated: 51 mL/min — ABNORMAL LOW (ref >60)
Glucose, Bld: 79 mg/dL (ref 70–99)
Potassium: 3.2 mmol/L — ABNORMAL LOW (ref 3.5–5.1)
Sodium: 136 mmol/L (ref 135–145)

## 2022-05-27 LAB — POCT I-STAT 7, (LYTES, BLD GAS, ICA,H+H)
Acid-Base Excess: 2 mmol/L (ref 0.0–2.0)
Bicarbonate: 26 mmol/L (ref 20.0–28.0)
Calcium, Ion: 1.23 mmol/L (ref 1.15–1.40)
HCT: 33 % — ABNORMAL LOW (ref 36.0–46.0)
Hemoglobin: 11.2 g/dL — ABNORMAL LOW (ref 12.0–15.0)
O2 Saturation: 100 %
Potassium: 3.1 mmol/L — ABNORMAL LOW (ref 3.5–5.1)
Sodium: 138 mmol/L (ref 135–145)
TCO2: 27 mmol/L (ref 22–32)
pCO2 arterial: 37.4 mmHg (ref 32–48)
pH, Arterial: 7.449 (ref 7.35–7.45)
pO2, Arterial: 407 mmHg — ABNORMAL HIGH (ref 83–108)

## 2022-05-27 LAB — CBC
HCT: 32.1 % — ABNORMAL LOW (ref 36.0–46.0)
Hemoglobin: 10.9 g/dL — ABNORMAL LOW (ref 12.0–15.0)
MCH: 32.2 pg (ref 26.0–34.0)
MCHC: 34 g/dL (ref 30.0–36.0)
MCV: 94.7 fL (ref 80.0–100.0)
Platelets: 236 10*3/uL (ref 150–400)
RBC: 3.39 MIL/uL — ABNORMAL LOW (ref 3.87–5.11)
RDW: 13.7 % (ref 11.5–15.5)
WBC: 10.1 10*3/uL (ref 4.0–10.5)
nRBC: 0 % (ref 0.0–0.2)

## 2022-05-27 LAB — MAGNESIUM: Magnesium: 2.1 mg/dL (ref 1.7–2.4)

## 2022-05-27 LAB — PHOSPHORUS: Phosphorus: 2.1 mg/dL — ABNORMAL LOW (ref 2.5–4.6)

## 2022-05-27 LAB — HEPARIN LEVEL (UNFRACTIONATED): Heparin Unfractionated: 0.34 IU/mL (ref 0.30–0.70)

## 2022-05-27 MED ORDER — K PHOS MONO-SOD PHOS DI & MONO 155-852-130 MG PO TABS: 500.0000 mg | ORAL_TABLET | Freq: Once | ORAL | Status: DC

## 2022-05-27 MED ORDER — POTASSIUM PHOSPHATES 15 MMOLE/5ML IV SOLN
30.0000 mmol | Freq: Once | INTRAVENOUS | Status: AC
  Administered 2022-05-27: 30 mmol via INTRAVENOUS
  Filled 2022-05-27: qty 10

## 2022-05-27 MED ORDER — SODIUM CHLORIDE 0.9 % IV SOLN: INTRAVENOUS | Status: DC

## 2022-05-28 DIAGNOSIS — E43 Unspecified severe protein-calorie malnutrition: Secondary | ICD-10-CM | POA: Insufficient documentation

## 2022-05-28 LAB — CBC
HCT: 32.7 % — ABNORMAL LOW (ref 36.0–46.0)
Hemoglobin: 10.4 g/dL — ABNORMAL LOW (ref 12.0–15.0)
MCH: 32.1 pg (ref 26.0–34.0)
MCHC: 31.8 g/dL (ref 30.0–36.0)
MCV: 100.9 fL — ABNORMAL HIGH (ref 80.0–100.0)
Platelets: 226 10*3/uL (ref 150–400)
RBC: 3.24 MIL/uL — ABNORMAL LOW (ref 3.87–5.11)
RDW: 13.7 % (ref 11.5–15.5)
WBC: 9.8 10*3/uL (ref 4.0–10.5)
nRBC: 0 % (ref 0.0–0.2)

## 2022-05-28 LAB — CULTURE, BLOOD (ROUTINE X 2)
Culture: NO GROWTH
Culture: NO GROWTH
Special Requests: ADEQUATE

## 2022-05-28 LAB — BASIC METABOLIC PANEL
Anion gap: 10 (ref 5–15)
BUN: 22 mg/dL (ref 8–23)
CO2: 20 mmol/L — ABNORMAL LOW (ref 22–32)
Calcium: 8 mg/dL — ABNORMAL LOW (ref 8.9–10.3)
Chloride: 108 mmol/L (ref 98–111)
Creatinine, Ser: 0.81 mg/dL (ref 0.44–1.00)
GFR, Estimated: >60 mL/min (ref >60)
Glucose, Bld: 81 mg/dL (ref 70–99)
Potassium: 3.9 mmol/L (ref 3.5–5.1)
Sodium: 138 mmol/L (ref 135–145)

## 2022-05-28 LAB — SURGICAL PATHOLOGY

## 2022-05-28 LAB — HEPARIN LEVEL (UNFRACTIONATED): Heparin Unfractionated: 0.37 IU/mL (ref 0.30–0.70)

## 2022-05-29 ENCOUNTER — Inpatient Hospital Stay (HOSPITAL_COMMUNITY): Payer: Medicare PPO

## 2022-05-29 LAB — BASIC METABOLIC PANEL
Anion gap: 7 (ref 5–15)
BUN: 29 mg/dL — ABNORMAL HIGH (ref 8–23)
CO2: 24 mmol/L (ref 22–32)
Calcium: 8.4 mg/dL — ABNORMAL LOW (ref 8.9–10.3)
Chloride: 106 mmol/L (ref 98–111)
Creatinine, Ser: 0.87 mg/dL (ref 0.44–1.00)
GFR, Estimated: >60 mL/min (ref >60)
Glucose, Bld: 97 mg/dL (ref 70–99)
Potassium: 3.4 mmol/L — ABNORMAL LOW (ref 3.5–5.1)
Sodium: 137 mmol/L (ref 135–145)

## 2022-05-29 LAB — CBC
HCT: 23.2 % — ABNORMAL LOW (ref 36.0–46.0)
HCT: 22.8 % — ABNORMAL LOW (ref 36.0–46.0)
Hemoglobin: 7.6 g/dL — ABNORMAL LOW (ref 12.0–15.0)
Hemoglobin: 7.6 g/dL — ABNORMAL LOW (ref 12.0–15.0)
MCH: 31.3 pg (ref 26.0–34.0)
MCH: 32.1 pg (ref 26.0–34.0)
MCHC: 32.8 g/dL (ref 30.0–36.0)
MCHC: 33.3 g/dL (ref 30.0–36.0)
MCV: 95.5 fL (ref 80.0–100.0)
MCV: 96.2 fL (ref 80.0–100.0)
Platelets: 251 10*3/uL (ref 150–400)
Platelets: 240 10*3/uL (ref 150–400)
RBC: 2.43 MIL/uL — ABNORMAL LOW (ref 3.87–5.11)
RBC: 2.37 MIL/uL — ABNORMAL LOW (ref 3.87–5.11)
RDW: 13.6 % (ref 11.5–15.5)
RDW: 13.7 % (ref 11.5–15.5)
WBC: 10.6 10*3/uL — ABNORMAL HIGH (ref 4.0–10.5)
WBC: 10.8 10*3/uL — ABNORMAL HIGH (ref 4.0–10.5)
nRBC: 0 % (ref 0.0–0.2)
nRBC: 0 % (ref 0.0–0.2)

## 2022-05-29 LAB — HEPARIN LEVEL (UNFRACTIONATED): Heparin Unfractionated: 0.55 IU/mL (ref 0.30–0.70)

## 2022-05-29 MED ORDER — POTASSIUM CHLORIDE 10 MEQ/100ML IV SOLN
10.0000 meq | INTRAVENOUS | Status: AC
  Administered 2022-05-29 (×4): 10 meq via INTRAVENOUS
  Filled 2022-05-29 (×4): qty 100

## 2022-05-30 LAB — BASIC METABOLIC PANEL
Anion gap: 5 (ref 5–15)
BUN: 27 mg/dL — ABNORMAL HIGH (ref 8–23)
CO2: 21 mmol/L — ABNORMAL LOW (ref 22–32)
Calcium: 8.3 mg/dL — ABNORMAL LOW (ref 8.9–10.3)
Chloride: 114 mmol/L — ABNORMAL HIGH (ref 98–111)
Creatinine, Ser: 0.83 mg/dL (ref 0.44–1.00)
GFR, Estimated: >60 mL/min (ref >60)
Glucose, Bld: 86 mg/dL (ref 70–99)
Potassium: 3.9 mmol/L (ref 3.5–5.1)
Sodium: 140 mmol/L (ref 135–145)

## 2022-05-30 LAB — CBC
HCT: 18.1 % — ABNORMAL LOW (ref 36.0–46.0)
Hemoglobin: 6 g/dL — CL (ref 12.0–15.0)
MCH: 32.1 pg (ref 26.0–34.0)
MCHC: 33.1 g/dL (ref 30.0–36.0)
MCV: 96.8 fL (ref 80.0–100.0)
Platelets: 222 10*3/uL (ref 150–400)
RBC: 1.87 MIL/uL — ABNORMAL LOW (ref 3.87–5.11)
RDW: 13.9 % (ref 11.5–15.5)
WBC: 7.2 10*3/uL (ref 4.0–10.5)
nRBC: 0 % (ref 0.0–0.2)

## 2022-05-30 LAB — HEPARIN LEVEL (UNFRACTIONATED): Heparin Unfractionated: <0.1 IU/mL — ABNORMAL LOW (ref 0.30–0.70)

## 2022-05-30 LAB — HEMOGLOBIN AND HEMATOCRIT, BLOOD
HCT: 23.8 % — ABNORMAL LOW (ref 36.0–46.0)
Hemoglobin: 8 g/dL — ABNORMAL LOW (ref 12.0–15.0)

## 2022-05-30 LAB — PREPARE RBC (CROSSMATCH)

## 2022-05-30 LAB — MAGNESIUM: Magnesium: 2 mg/dL (ref 1.7–2.4)

## 2022-05-30 MED ORDER — ACETAMINOPHEN 325 MG PO TABS
650.0000 mg | ORAL_TABLET | Freq: Once | ORAL | Status: AC
  Administered 2022-05-30: 650 mg via ORAL
  Filled 2022-05-30: qty 2

## 2022-05-30 MED ORDER — SODIUM CHLORIDE 0.9% IV SOLUTION: Freq: Once | INTRAVENOUS | Status: AC

## 2022-05-30 MED ORDER — ATENOLOL 25 MG PO TABS
25.0000 mg | ORAL_TABLET | Freq: Every day | ORAL | Status: DC
  Administered 2022-05-30 – 2022-06-05 (×7): 25 mg via ORAL
  Filled 2022-05-30 (×8): qty 1

## 2022-05-30 MED ORDER — FAMOTIDINE IN NACL 20-0.9 MG/50ML-% IV SOLN
20.0000 mg | Freq: Two times a day (BID) | INTRAVENOUS | Status: DC
  Administered 2022-05-30 – 2022-05-31 (×4): 20 mg via INTRAVENOUS
  Filled 2022-05-30 (×5): qty 50

## 2022-05-30 MED ORDER — FUROSEMIDE 10 MG/ML IJ SOLN
20.0000 mg | Freq: Once | INTRAMUSCULAR | Status: AC
  Administered 2022-05-30: 20 mg via INTRAVENOUS
  Filled 2022-05-30: qty 2

## 2022-05-30 MED ORDER — MORPHINE SULFATE (PF) 2 MG/ML IV SOLN
1.0000 mg | INTRAVENOUS | Status: DC | PRN
  Administered 2022-05-30: 1 mg via INTRAVENOUS
  Filled 2022-05-30 (×2): qty 1

## 2022-05-30 MED ORDER — DIPHENHYDRAMINE HCL 25 MG PO CAPS
25.0000 mg | ORAL_CAPSULE | Freq: Once | ORAL | Status: AC
  Administered 2022-05-30: 25 mg via ORAL
  Filled 2022-05-30: qty 1

## 2022-05-31 LAB — COMPREHENSIVE METABOLIC PANEL
ALT: 19 U/L (ref 0–44)
AST: 25 U/L (ref 15–41)
Albumin: 1.7 g/dL — ABNORMAL LOW (ref 3.5–5.0)
Alkaline Phosphatase: 67 U/L (ref 38–126)
Anion gap: 8 (ref 5–15)
BUN: 19 mg/dL (ref 8–23)
CO2: 22 mmol/L (ref 22–32)
Calcium: 8.1 mg/dL — ABNORMAL LOW (ref 8.9–10.3)
Chloride: 110 mmol/L (ref 98–111)
Creatinine, Ser: 0.74 mg/dL (ref 0.44–1.00)
GFR, Estimated: >60 mL/min (ref >60)
Glucose, Bld: 85 mg/dL (ref 70–99)
Potassium: 3.2 mmol/L — ABNORMAL LOW (ref 3.5–5.1)
Sodium: 140 mmol/L (ref 135–145)
Total Bilirubin: 0.8 mg/dL (ref 0.3–1.2)
Total Protein: 4 g/dL — ABNORMAL LOW (ref 6.5–8.1)

## 2022-05-31 LAB — CBC WITH DIFFERENTIAL/PLATELET
Abs Immature Granulocytes: 0.06 10*3/uL (ref 0.00–0.07)
Basophils Absolute: 0.1 10*3/uL (ref 0.0–0.1)
Basophils Relative: 1 %
Eosinophils Absolute: 0.3 10*3/uL (ref 0.0–0.5)
Eosinophils Relative: 5 %
HCT: 22 % — ABNORMAL LOW (ref 36.0–46.0)
Hemoglobin: 7.3 g/dL — ABNORMAL LOW (ref 12.0–15.0)
Immature Granulocytes: 1 %
Lymphocytes Relative: 18 %
Lymphs Abs: 1.3 10*3/uL (ref 0.7–4.0)
MCH: 30.2 pg (ref 26.0–34.0)
MCHC: 33.2 g/dL (ref 30.0–36.0)
MCV: 90.9 fL (ref 80.0–100.0)
Monocytes Absolute: 0.7 10*3/uL (ref 0.1–1.0)
Monocytes Relative: 10 %
Neutro Abs: 4.5 10*3/uL (ref 1.7–7.7)
Neutrophils Relative %: 65 %
Platelets: 236 10*3/uL (ref 150–400)
RBC: 2.42 MIL/uL — ABNORMAL LOW (ref 3.87–5.11)
RDW: 17.3 % — ABNORMAL HIGH (ref 11.5–15.5)
WBC: 6.9 10*3/uL (ref 4.0–10.5)
nRBC: 0 % (ref 0.0–0.2)

## 2022-05-31 LAB — BPAM RBC
Blood Product Expiration Date: 202307102359
ISSUE DATE / TIME: 202306211241
Unit Type and Rh: 6200

## 2022-05-31 LAB — TYPE AND SCREEN
ABO/RH(D): A POS
Antibody Screen: NEGATIVE
Unit division: 0

## 2022-05-31 MED ORDER — BOOST / RESOURCE BREEZE PO LIQD CUSTOM
1.0000 | Freq: Three times a day (TID) | ORAL | Status: DC
  Administered 2022-05-31 – 2022-06-06 (×17): 1 via ORAL

## 2022-05-31 MED ORDER — POTASSIUM CHLORIDE 20 MEQ PO PACK
80.0000 meq | PACK | Freq: Once | ORAL | Status: AC
  Administered 2022-05-31: 80 meq via ORAL
  Filled 2022-05-31: qty 4

## 2022-05-31 MED ORDER — MORPHINE SULFATE (PF) 2 MG/ML IV SOLN: 1.0000 mg | INTRAVENOUS | Status: DC | PRN

## 2022-05-31 MED ORDER — TRAMADOL HCL 50 MG PO TABS
50.0000 mg | ORAL_TABLET | Freq: Three times a day (TID) | ORAL | Status: DC | PRN
  Administered 2022-05-31 – 2022-06-05 (×8): 50 mg via ORAL
  Filled 2022-05-31 (×9): qty 1

## 2022-05-31 MED ORDER — ACETAMINOPHEN 325 MG PO TABS
650.0000 mg | ORAL_TABLET | Freq: Four times a day (QID) | ORAL | Status: DC | PRN
  Administered 2022-06-04 – 2022-06-05 (×3): 650 mg via ORAL
  Filled 2022-05-31 (×3): qty 2

## 2022-05-31 MED ORDER — PANTOPRAZOLE SODIUM 40 MG IV SOLR
40.0000 mg | Freq: Two times a day (BID) | INTRAVENOUS | Status: DC
  Administered 2022-05-31 – 2022-06-01 (×3): 40 mg via INTRAVENOUS
  Filled 2022-05-31 (×3): qty 10

## 2022-06-01 LAB — BASIC METABOLIC PANEL
Anion gap: 9 (ref 5–15)
BUN: 14 mg/dL (ref 8–23)
CO2: 20 mmol/L — ABNORMAL LOW (ref 22–32)
Calcium: 8.4 mg/dL — ABNORMAL LOW (ref 8.9–10.3)
Chloride: 111 mmol/L (ref 98–111)
Creatinine, Ser: 0.81 mg/dL (ref 0.44–1.00)
GFR, Estimated: >60 mL/min (ref >60)
Glucose, Bld: 94 mg/dL (ref 70–99)
Potassium: 3.4 mmol/L — ABNORMAL LOW (ref 3.5–5.1)
Sodium: 140 mmol/L (ref 135–145)

## 2022-06-01 LAB — CBC
HCT: 24.9 % — ABNORMAL LOW (ref 36.0–46.0)
Hemoglobin: 8 g/dL — ABNORMAL LOW (ref 12.0–15.0)
MCH: 29.6 pg (ref 26.0–34.0)
MCHC: 32.1 g/dL (ref 30.0–36.0)
MCV: 92.2 fL (ref 80.0–100.0)
Platelets: 247 10*3/uL (ref 150–400)
RBC: 2.7 MIL/uL — ABNORMAL LOW (ref 3.87–5.11)
RDW: 17.3 % — ABNORMAL HIGH (ref 11.5–15.5)
WBC: 5.9 10*3/uL (ref 4.0–10.5)
nRBC: 0 % (ref 0.0–0.2)

## 2022-06-01 LAB — MAGNESIUM: Magnesium: 1.8 mg/dL (ref 1.7–2.4)

## 2022-06-01 LAB — HEPARIN LEVEL (UNFRACTIONATED): Heparin Unfractionated: 0.23 IU/mL — ABNORMAL LOW (ref 0.30–0.70)

## 2022-06-01 MED ORDER — POTASSIUM CHLORIDE 20 MEQ PO PACK
60.0000 meq | PACK | Freq: Once | ORAL | Status: AC
Start: 2022-06-01 — End: 2022-06-01
  Administered 2022-06-01: 60 meq via ORAL
  Filled 2022-06-01: qty 3

## 2022-06-01 MED ORDER — FAMOTIDINE 20 MG PO TABS
20.0000 mg | ORAL_TABLET | Freq: Every day | ORAL | Status: DC
  Administered 2022-06-01 – 2022-06-06 (×6): 20 mg via ORAL
  Filled 2022-06-01 (×6): qty 1

## 2022-06-01 MED ORDER — PANTOPRAZOLE SODIUM 40 MG PO TBEC
40.0000 mg | DELAYED_RELEASE_TABLET | Freq: Every day | ORAL | Status: DC
Start: 2022-06-01 — End: 2022-06-03
  Administered 2022-06-01 – 2022-06-03 (×3): 40 mg via ORAL
  Filled 2022-06-01 (×3): qty 1

## 2022-06-01 MED ORDER — MAGNESIUM SULFATE 2 GM/50ML IV SOLN
2.0000 g | Freq: Once | INTRAVENOUS | Status: AC
Start: 2022-06-01 — End: 2022-06-01
  Administered 2022-06-01: 2 g via INTRAVENOUS
  Filled 2022-06-01: qty 50

## 2022-06-01 MED ORDER — HEPARIN (PORCINE) 25000 UT/250ML-% IV SOLN
900.0000 [IU]/h | INTRAVENOUS | Status: DC
  Administered 2022-06-01: 800 [IU]/h via INTRAVENOUS
  Administered 2022-06-02: 900 [IU]/h via INTRAVENOUS
  Filled 2022-06-01 (×2): qty 250

## 2022-06-01 NOTE — TOC Progression Note (Signed)
Transition of Care [facility]) - Progression Note    Patient Details  Name: Diane Robertson MRN: [id number] Date of Birth: [birth year]  Transition of Care [facility]) CM/SW Contact  Lorri Frederick, LCSW Phone Number: 06/01/2022, 11:08 AM  Clinical Narrative:   [facility] request submitted in Nav[facility] with start date of Monday, 6/26.  [facility] informed of estimated DC on that date.     Expected Discharge Plan: Skilled Nursing Facility Barriers to Discharge: Continued Medical Work up  Expected Discharge Plan and Services Expected Discharge Plan: Skilled Nursing Facility In-house Referral: Clinical Social Work     Living arrangements for the past 2 months: Single Family Home                                       Social Determinants of Health (SDOH) Interventions    Readmission Risk Interventions     No data to display

## 2022-06-01 NOTE — Progress Notes (Signed)
PROGRESS NOTE   Diane Robertson  HTD:[id number] DOB: 09/02/1929 DOA: 05/23/2022 PCP: Wynona Luna, MD  Brief Narrative:   88 year old white female Known underlying HTN, diverticulitis probable dementia Admit 05/23/2022 abdominal pain hospitalized twice in [facility] for this recently-developed worsening abdominal pain loose stool also lower leg tenderness Work-up in [facility] ED = CTA abdomen pelvis = acute segmental subsegmental PE right lower lobe of lung Inflammatory mass punctate focus of extraluminal gas distal small bowel  6/15: General surgery consulted-recommend ex lap with SB resection, palliative care consulted as above 6/16: exploratory laparotomy with small bowel resection 6/17: Postop ileus with NG tube in place 6/21: Melanotic stool, NG tube removed sips trial  Hospital-Problem based course  Inflammatory mass distal small bowel?  Diverticulitis--- pathology without malignancy ex lap/ bowel resection 6/16 Complication of postoperative ileus Melanotic stools last noted 6/21-no further stool-NG tube DC 6/21 Hemoglobin seems to have stabilized--graduating clears, boost Start low dose heparin for anticoagulation 6/23 Pain control tramadol 50 every 8 prn-- DC morphine 6/23 Zosyn DC previously-saline lock Unexpected anemia of acute blood loss Melena probably from post anastomotic bleed S/p 05/30/2022 1 unit PRBC PPI and H2 blocker changed to p.o. today Acute subsegmental PE right lower lobe lung Bilateral DVTs lower extremity Resume heparin- 6/23 HTN Continue atenolol 25 daily Losartan has been discontinued--previously used hydralazine has been held Probable underlying dementia Reorient as able-is a little bit more orientable today  DVT prophylaxis: SCD at this time Code Status: Full Family Communication: Called patient's niece [name] [phone] Disposition:  Status is: Inpatient Remains inpatient appropriate because:   The option of heparin and  ensuring that no further bleeding Be discharged to skilled facility on 6/26 probably   Consultants:  General surgery  Procedures:   Antimicrobials:     Subjective:  Pleasant coherent no distress-she ate about 50% of her meals It is unclear whether she had any further dark stools-this was not reported to tech In good spirits pretty happy and is aware that he eventually will need to go to rehab We spoke about the impediments to this and the need for cautiously resuming anticoagulation  Objective: Vitals:   05/31/22 2015 06/01/22 0013 06/01/22 0433 06/01/22 0806  BP: (!) 152/59 (!) 147/61 (!) 145/59 129/62  Pulse:  62 61 (!) 55  Resp:  18 16 19   Temp: 97.9 F (36.6 C) 97.8 F (36.6 C) 98 F (36.7 C) 98 F (36.7 C)  TempSrc: Oral Oral Oral   SpO2: 98% 97% 96% 94%  Weight:      Height:        Intake/Output Summary (Last 24 hours) at 06/01/2022 0948 Last data filed at 06/01/2022 0437 Gross per 24 hour  Intake 892.34 ml  Output 600 ml  Net 292.34 ml    Filed Weights   05/23/22 1400  Weight: 58.1 kg    Examination:  More coherent no distress less sleepy Chest clear no rales rhonchi wheeze Abdomen soft with midline scar periumbilical No lower extremity edema S1-S2 no murmur telemetry shows sinus rhythm ROM is intact moving all 4 limbs relatively well   Data Reviewed: personally reviewed   CBC    Component Value Date/Time   WBC 5.9 06/01/2022 0424   RBC 2.70 (L) 06/01/2022 0424   HGB 8.0 (L) 06/01/2022 0424   HCT 24.9 (L) 06/01/2022 0424   PLT 247 06/01/2022 0424   MCV 92.2 06/01/2022 0424   MCH 29.6 06/01/2022 0424   MCHC 32.1 06/01/2022 0424  RDW 17.3 (H) 06/01/2022 0424   LYMPHSABS 1.3 05/31/2022 0313   MONOABS 0.7 05/31/2022 0313   EOSABS 0.3 05/31/2022 0313   BASOSABS 0.1 05/31/2022 0313      Latest Ref Rng & Units 06/01/2022    3:24 AM 05/31/2022    3:13 AM 05/30/2022    7:35 AM  CMP  Glucose 70 - 99 mg/dL 94  85  86   BUN 8 - 23 mg/dL 14   19  27    Creatinine 0.44 - 1.00 mg/dL  2.95  1.88   Sodium 135 - 145 mmol/L 140  140  140   Potassium 3.5 - 5.1 mmol/L 3.4  3.2  3.9   Chloride 98 - 111 mmol/L 111  110  114   CO2 22 - 32 mmol/L 20  22  21    Calcium 8.9 - 10.3 mg/dL 8.4  8.1  8.3   Total Protein 6.5 - 8.1 g/dL  4.0    Total Bilirubin 0.3 - 1.2 mg/dL  0.8    Alkaline Phos 38 - 126 U/L  67    AST 15 - 41 U/L  25    ALT 0 - 44 U/L  19       Radiology Studies: No results found.   Scheduled Meds:  atenolol  25 mg Oral Q2200   Chlorhexidine Gluconate Cloth  6 each Topical Daily   feeding supplement  1 Container Oral TID BM   pantoprazole (PROTONIX) IV  40 mg Intravenous Q12H   sodium chloride flush  3 mL Intravenous Q12H   Continuous Infusions:  sodium chloride 50 mL/hr at 06/01/22 0652   famotidine (PEPCID) IV 20 mg (05/31/22 2014)     LOS: 8 days   Time spent: 55  06/02/22, MD [facility] To contact the attending provider between 7A-7P or the covering provider during after hours 7P-7A, please log into the web site [email or web address] and access using universal [place] password for that web site. If you do not have the password, please call the hospital operator.  06/01/2022, 9:48 AM

## 2022-06-01 NOTE — Progress Notes (Addendum)
Patient ID: Diane Robertson, female   DOB: 1929-04-20, 88 y.o.   MRN: [id number] [facility] Surgery Progress Note  7 Days Post-Op  Subjective: CC-  Tolerating a solid breakfast but feels full. She is unsure of her last BM. Per I&Os no BMs yesterday. Sitting up in chair.   Hgb stable this AM 8.0 from 7.3  Objective: Vital signs in last 24 hours: Temp:  [97.6 F (36.4 C)-98 F (36.7 C)] 98 F (36.7 C) (06/23 0806) Pulse Rate:  [55-64] 55 (06/23 0806) Resp:  [16-19] 19 (06/23 0806) BP: (119-152)/(50-62) 129/62 (06/23 0806) SpO2:  [94 %-98 %] 94 % (06/23 0806) Last BM Date : 05/31/22  Intake/Output from previous day: 06/22 0701 - 06/23 0700 In: 942.3 [P.O.:670; I.V.:272.3] Out: 600 [Urine:600] Intake/Output this shift: No intake/output data recorded.  PE: Gen:  Alert, NAD, pleasant GI: soft, mild distension, minimal diffuse tenderness, midline incision with staples present and no cellulitis + bowel sounds  Lab Results:  Recent Labs    05/31/22 0313 06/01/22 0424  WBC 6.9 5.9  HGB 7.3* 8.0*  HCT 22.0* 24.9*  PLT 236 247   BMET Recent Labs    05/31/22 0313 06/01/22 0324  NA 140 140  K 3.2* 3.4*  CL 110 111  CO2 22 20*  GLUCOSE 85 94  BUN 19 14  CREATININE 0.74 0.81  CALCIUM 8.1* 8.4*   PT/INR No results for input(s): "LABPROT", "INR" in the last 72 hours. CMP     Component Value Date/Time   NA 140 06/01/2022 0324   K 3.4 (L) 06/01/2022 0324   CL 111 06/01/2022 0324   CO2 20 (L) 06/01/2022 0324   GLUCOSE 94 06/01/2022 0324   BUN 14 06/01/2022 0324   CREATININE 0.81 06/01/2022 0324   CALCIUM 8.4 (L) 06/01/2022 0324   PROT 4.0 (L) 05/31/2022 0313   ALBUMIN 1.7 (L) 05/31/2022 0313   AST 25 05/31/2022 0313   ALT 19 05/31/2022 0313   ALKPHOS 67 05/31/2022 0313   BILITOT 0.8 05/31/2022 0313   GFRNONAA >60 06/01/2022 0324   Lipase  No results found for: "LIPASE"     Studies/Results: No results found.  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 Andrey Campanile - path w/out malignancy  - afebrile, VSS, WBC normal - anastomotic ozzing on hep gtt. appropriate rise in hemoglobin after 1 unit PRBCs 6/2. A couple more melanotic stools 6/22. Hgb stable today 8.0 from 7.3, ok to re-start hep gtt without bolus and monitor for melena. CBC in AM.  - will advance to SOFT and continue protein supplements. - continue PT/OT, mobilize - recommending SNF  ID - zosyn 6/15>>6/17 FEN - SOFT, Boost; give Kcl and Mg for goal of Mg > 2.0 and K > 4.0 VTE - SCDs, ok to resume hep gtt today  Acute subsegmental PE right lower lobe lung, Bilateral DVTs lower extremity HTN Dementia   LOS: 8  days    Adam Phenix, [facility] Surgery 06/01/2022, 10:01 AM Please see Amion for pager number during day hours 7:00am-4:30pm

## 2022-06-01 NOTE — Progress Notes (Signed)
Pharmacy Consult for IV Heparin Indication: pulmonary embolus and BLE DVTs  Allergies  Allergen Reactions   Bee Venom Other (See Comments)    UNK reaction    Patient Measurements: Height: 5' 3.5" (161.3 cm) Weight: 58.1 kg (128 lb) IBW/kg (Calculated) : 53.55 HEPARIN DW (KG): 58.1  Vital Signs: Temp: 98 F (36.7 C) (06/23 0806) Temp Source: Oral (06/23 0433) BP: 129/62 (06/23 0806) Pulse Rate: 55 (06/23 0806)  Labs: Recent Labs    05/30/22 0735 05/30/22 1802 05/31/22 0313 06/01/22 0324 06/01/22 0424  HGB 6.0* 8.0* 7.3*  --  8.0*  HCT 18.1* 23.8* 22.0*  --  24.9*  PLT 222  --  236  --  247  HEPARINUNFRC <0.10*  --   --   --   --   CREATININE 0.83  --  0.74 0.81  --      Estimated Creatinine Clearance: 39.1 mL/min (by C-G formula based on SCr of 0.81 mg/dL).  Assessment: Diane Robertson a 88 y.o. female presented with epigastric pain, now found to have acute segmental and subsegmental pulmonary embolus of the RLL and BLE DVTs.  Pharmacy consulted on 05/24/22 for heparin dosing. Heparin was held 6/20-6/22 due to melena and probable anastomotic bleeding per surgery. Reconsulted to start heparin 6/23.   Patient was therapeutic at low end on 800 units/hr. H/H 8, plt stable.   Goal of Therapy:  Heparin level 0.3-0.7 units/ml Monitor platelets by anticoagulation protocol: Yes   Plan:  Restart heparin 800 units/hr   F/u 8hr HL  Monitor daily heparin level, CBC Monitor for signs/symptoms of bleeding   F/u switch to Apixaban when able  Alphia Moh, PharmD, BCPS, BCCP Clinical Pharmacist  Please check AMION for all [facility] phone numbers After 10:00 PM, call Main Pharmacy [phone]

## 2022-06-01 NOTE — Hospital Course (Signed)
1 

## 2022-06-01 NOTE — Progress Notes (Signed)
6/23 Spoke to [facility] in the patient's room and IM Letter was located, Patient was unable to acknowledge letter due to behavior.

## 2022-06-01 NOTE — Care Management Important Message (Signed)
Important Message  Patient Details  Name: [name] MRN: [id number] Date of Birth: Dec 04, 1929   Medicare Important Message Given:  Other (see comment)     Sherilyn Banker 06/01/2022, 1:41 PM

## 2022-06-01 NOTE — Progress Notes (Signed)
Occupational Therapy Treatment Patient Details Name: Diane Robertson MRN: [id number] DOB: [birth year] Today's Date: 06/01/2022   History of present illness Pt is a 88yo F admitted on 6/4 with c/o abdominal pain. Found to have bilateral PEs and small bowel ostruction. S/p small bowel resection on 6/16. Pt with 2 recent admission for diverticulitis. PMH: Dementia, HTN, diverticulitis.   OT comments  Patient with good improvement with mobility and ADL completion from the evaluation.  Patient ambulating to the bathroom with Min A and RW.  Decreased safety, insight and poor dynamic stand balance noted.  Up to Mod A with lower body ADL from sit/stand level.  OT will continue efforts in the acute setting.  Patient will need to have 24 hour assist to return home safety.  Recommend short term rehab prior to returning home to maximize her functional status.  .     Recommendations for follow up therapy are one component of a multi-disciplinary discharge planning process, led by the attending physician.  Recommendations may be updated based on patient status, additional functional criteria and insurance authorization.    Follow Up Recommendations  Skilled nursing-short term rehab (<3 hours/day)    Assistance Recommended at Discharge    Patient can return home with the following  A little help with walking and/or transfers;Help with stairs or ramp for entrance;Assist for transportation;Assistance with cooking/housework;Direct supervision/assist for financial management;Direct supervision/assist for medications management;A little help with bathing/dressing/bathroom   Equipment Recommendations  None recommended by OT    Recommendations for Other Services      Precautions / Restrictions Precautions Precautions: Fall Precaution Comments: abdominal incision, stress incontinence Restrictions Weight Bearing Restrictions: No       Mobility Bed Mobility   Bed Mobility: Sidelying to Sit, Sit to Supine    Sidelying to sit: Min guard   Sit to supine: Min guard        Transfers Overall transfer level: Needs assistance Equipment used: Rolling walker (2 wheels) Transfers: Sit to/from Stand Sit to Stand: Min assist                 Balance Overall balance assessment: Needs assistance Sitting-balance support: Feet supported Sitting balance-Leahy Scale: Good     Standing balance support: Bilateral upper extremity supported Standing balance-Leahy Scale: Poor                             ADL either performed or assessed with clinical judgement   ADL       Grooming: Wash/dry hands;Wash/dry face;Bed level;Minimal assistance;Standing           Upper Body Dressing : Minimal assistance;Sitting   Lower Body Dressing: Moderate assistance;Sit to/from stand   Toilet Transfer: Minimal assistance;Regular Toilet;Rolling walker (2 wheels)                  Extremity/Trunk Assessment Upper Extremity Assessment Upper Extremity Assessment: Overall WFL for tasks assessed   Lower Extremity Assessment Lower Extremity Assessment: Defer to PT evaluation   Cervical / Trunk Assessment Cervical / Trunk Assessment: Kyphotic                      Cognition Arousal/Alertness: Awake/alert Behavior During Therapy: WFL for tasks assessed/performed Overall Cognitive Status: History of cognitive impairments - at baseline Area of Impairment: Safety/judgement                     Memory: Decreased short-term memory, Decreased  recall of precautions   Safety/Judgement: Decreased awareness of safety                                 Pertinent Vitals/ Pain       Pain Assessment Faces Pain Scale: Hurts a little bit Pain Location: abdomen and back Pain Descriptors / Indicators: Constant, Grimacing, Guarding Pain Intervention(s): Monitored during session                                                          Frequency   Min 2X/week        Progress Toward Goals  OT Goals(current goals can now be found in the care plan section)  Progress towards OT goals: Progressing toward goals  Acute Rehab OT Goals OT Goal Formulation: With patient Time For Goal Achievement: 06/08/22 Potential to Achieve Goals: Good ADL Goals Pt Will Perform Grooming: with set-up;standing Pt Will Perform Lower Body Dressing: with min guard assist;sit to/from stand Pt Will Transfer to Toilet: with supervision;ambulating;regular height toilet  Plan      Co-evaluation                 AM-PAC OT "6 Clicks" Daily Activity     Outcome Measure   Help from another person eating meals?: None Help from another person taking care of personal grooming?: A Little Help from another person toileting, which includes using toliet, bedpan, or urinal?: A Little Help from another person bathing (including washing, rinsing, drying)?: A Lot Help from another person to put on and taking off regular upper body clothing?: A Little Help from another person to put on and taking off regular lower body clothing?: A Lot 6 Click Score: 17    End of Session        Activity Tolerance Patient tolerated treatment well   Patient Left in bed;with call bell/phone within reach;with bed alarm set   Nurse Communication Other (comment) (used the restroom)        Time: 1345-1410 OT Time Calculation (min): 25 min  Charges: OT General Charges $OT Visit: 1 Visit OT Treatments $Self Care/Home Management : 23-37 mins  06/01/2022  RP, OTR/L  Acute Rehabilitation Services  Office:  [phone]   Suzanna Obey 06/01/2022, 2:19 PM

## 2022-06-01 NOTE — Progress Notes (Signed)
Pharmacy Consult for IV Heparin Indication: pulmonary embolus and BLE DVTs  Allergies  Allergen Reactions   Bee Venom Other (See Comments)    UNK reaction    Patient Measurements: Height: 5' 3.5" (161.3 cm) Weight: 58.1 kg (128 lb) IBW/kg (Calculated) : 53.55 HEPARIN DW (KG): 58.1  Vital Signs: Temp: 98 F (36.7 C) (06/23 0806) BP: 129/62 (06/23 0806) Pulse Rate: 55 (06/23 0806)  Labs: Recent Labs    05/30/22 0735 05/30/22 1802 05/31/22 0313 06/01/22 0324 06/01/22 0424 06/01/22 1842  HGB 6.0* 8.0* 7.3*  --  8.0*  --   HCT 18.1* 23.8* 22.0*  --  24.9*  --   PLT 222  --  236  --  247  --   HEPARINUNFRC <0.10*  --   --   --   --  0.23*  CREATININE 0.83  --  0.74 0.81  --   --      Estimated Creatinine Clearance: 39.1 mL/min (by C-G formula based on SCr of 0.81 mg/dL).  Assessment: Diane Robertson a 88 y.o. female presented with epigastric pain, now found to have acute segmental and subsegmental pulmonary embolus of the RLL and BLE DVTs.  Pharmacy consulted on 05/24/22 for heparin dosing. Heparin was held 6/20-6/22 due to melena and probable anastomotic bleeding per surgery. Reconsulted to start heparin 6/23.   Initial heparin level slightly subtherapeutic on 800 units/hr  Goal of Therapy:  Heparin level 0.3-0.7 units/ml Monitor platelets by anticoagulation protocol: Yes   Plan:  Increase heparin gtt 900 units/hr F/u heparin level with AM labs  [name], PharmD Clinical Pharmacist ED Pharmacist Phone # [phone] 06/01/2022 7:37 PM

## 2022-06-01 NOTE — Progress Notes (Addendum)
Palliative Medicine Inpatient Follow Up Note  HPI: Patient is a pleasant 88 year old female history of dementia, hypertension, diverticulitis presented to the ED with abdominal pain.  Patient noted to be a poor historian.  Patient with complaints of generalized abdominal pain for at least a month noted to have been hospitalized twice recently and treated for acute diverticulitis but worsening pain in recent days.  Patient on arrival in the ED noted to be afebrile, sats of mid to upper 90s on room air.  CT angiogram of abdomen and pelvis done noted for a acute segmental and subsegmental PE within the right lower lobe and inflammatory mass with punctate focus of extraluminal gas at the distal small bowel.  General surgery was consulted.  Patient placed empirically on IV heparin and patient transferred to [facility]. Palliative care has been asked to get involved to further address goals of care.  Today's Discussion 06/01/2022  *Please note that this is a verbal dictation therefore any spelling or grammatical errors are due to the "[place] One" system interpretation.  Chart reviewed inclusive of vital signs, progress notes, laboratory results, and diagnostic images. POD#7 of a small bowel resection.   I met with Diane Robertson at bedside this morning.  She is more comfortable this morning than she had been on prior visits.  Stanton Kidney shares she feels a little bit more foggy in her mind this morning and asked me to reorient her as to the reason she is here and what the plan is.  We reviewed that she came in for perforated bowel and she had surgery a week ago.  We discussed that she is improving every day and the plan will be for her to go to rehabilitation from here.   Diane Robertson shared that she felt she had to go to the bathroom after which time I went ahead and got her up she was able to urinate and get in the chair with standby assistance.  [name] shares she only had pain when getting from the lying to sitting  position but otherwise felt fairly well.  Questions and concerns addressed   Palliative Support Provided  Objective Assessment: Vital Signs Vitals:   06/01/22 0433 06/01/22 0806  BP: (!) 145/59 129/62  Pulse: 61 (!) 55  Resp: 16 19  Temp: 98 F (36.7 C) 98 F (36.7 C)  SpO2: 96% 94%    Intake/Output Summary (Last 24 hours) at 06/01/2022 1655 Last data filed at 06/01/2022 1603 Gross per 24 hour  Intake 610 ml  Output 850 ml  Net -240 ml    Last Weight  Most recent update: 05/23/2022  2:01 PM    Weight  58.1 kg (128 lb)            Gen: Elderly Caucasian female in mild distress HEENT: moist mucous membranes CV: Regular rate and rhythm  PULM: clear to auscultation bilaterally  ABD: Tender right upper mid abdomen EXT: No edema Neuro: Alert and oriented x1-2  SUMMARY OF RECOMMENDATIONS   DNAR/DNI   POD #7 SBR - doing well at this point   Goals are for improvement/recovery  Continue to mobilize daily  Pain is controlled with Percocet  Chaplain involvement    Ongoing incremental Palliative support  MDM: Moderate  ______________________________________________________________________________________ Tacey Ruiz Marvell Palliative Medicine Team Team Cell Phone: [phone] Please utilize secure chat with additional questions, if there is no response within 30 minutes please call the above phone number  Palliative Medicine Team providers are available by phone from  7am to 7pm daily and can be reached through the team cell phone.  Should this patient require assistance outside of these hours, please call the patient's attending physician.

## 2022-06-02 LAB — CBC
HCT: 23.4 % — ABNORMAL LOW (ref 36.0–46.0)
Hemoglobin: 7.4 g/dL — ABNORMAL LOW (ref 12.0–15.0)
MCH: 29.2 pg (ref 26.0–34.0)
MCHC: 31.6 g/dL (ref 30.0–36.0)
MCV: 92.5 fL (ref 80.0–100.0)
Platelets: 252 10*3/uL (ref 150–400)
RBC: 2.53 MIL/uL — ABNORMAL LOW (ref 3.87–5.11)
RDW: 17.2 % — ABNORMAL HIGH (ref 11.5–15.5)
WBC: 6.7 10*3/uL (ref 4.0–10.5)
nRBC: 0 % (ref 0.0–0.2)

## 2022-06-02 LAB — BASIC METABOLIC PANEL
Anion gap: 7 (ref 5–15)
BUN: 12 mg/dL (ref 8–23)
CO2: 23 mmol/L (ref 22–32)
Calcium: 8.3 mg/dL — ABNORMAL LOW (ref 8.9–10.3)
Chloride: 109 mmol/L (ref 98–111)
Creatinine, Ser: 0.87 mg/dL (ref 0.44–1.00)
GFR, Estimated: >60 mL/min (ref >60)
Glucose, Bld: 108 mg/dL — ABNORMAL HIGH (ref 70–99)
Potassium: 3.5 mmol/L (ref 3.5–5.1)
Sodium: 139 mmol/L (ref 135–145)

## 2022-06-02 LAB — HEPARIN LEVEL (UNFRACTIONATED)
Heparin Unfractionated: 0.49 IU/mL (ref 0.30–0.70)
Heparin Unfractionated: 1.09 IU/mL — ABNORMAL HIGH (ref 0.30–0.70)

## 2022-06-02 LAB — MAGNESIUM: Magnesium: 2.1 mg/dL (ref 1.7–2.4)

## 2022-06-02 MED ORDER — HEPARIN (PORCINE) 25000 UT/250ML-% IV SOLN
750.0000 [IU]/h | INTRAVENOUS | Status: DC
  Administered 2022-06-02: 750 [IU]/h via INTRAVENOUS

## 2022-06-02 NOTE — Plan of Care (Signed)

## 2022-06-02 NOTE — Progress Notes (Signed)
Pharmacy Consult for IV Heparin Indication: pulmonary embolus and BLE DVTs  Allergies  Allergen Reactions   Bee Venom Other (See Comments)    UNK reaction    Patient Measurements: Height: 5' 3.5" (161.3 cm) Weight: 58.1 kg (128 lb) IBW/kg (Calculated) : 53.55 HEPARIN DW (KG): 58.1  Vital Signs: Temp: 98.3 F (36.8 C) (06/23 2021) Temp Source: Oral (06/23 2021) BP: 147/55 (06/23 2021) Pulse Rate: 64 (06/23 2021)  Labs: Recent Labs    05/30/22 0735 05/30/22 1802 05/31/22 0313 06/01/22 0324 06/01/22 0424 06/01/22 1842 06/02/22 0246  HGB 6.0*   < > 7.3*  --  8.0*  --  7.4*  HCT 18.1*   < > 22.0*  --  24.9*  --  23.4*  PLT 222  --  236  --  247  --  252  HEPARINUNFRC <0.10*  --   --   --   --  0.23* 0.49  CREATININE 0.83  --  0.74 0.81  --   --  0.87   < > = values in this interval not displayed.     Estimated Creatinine Clearance: 36.4 mL/min (by C-G formula based on SCr of 0.87 mg/dL).  Assessment: Diane Robertson a 88 y.o. female presented with epigastric pain, now found to have acute segmental and subsegmental pulmonary embolus of the RLL and BLE DVTs.  Pharmacy consulted on 05/24/22 for heparin dosing. Heparin was held 6/20-6/22 due to melena and probable anastomotic bleeding per surgery. Reconsulted to start heparin 6/23.   Heparin level 0.49 (therapeutic) on current rate. Hgb 7.4  Goal of Therapy:  Heparin level 0.3-0.7 units/ml Monitor platelets by anticoagulation protocol: Yes   Plan:  Continue heparin gtt at 900 units/hr Heparin level in 8 hours F/u heparin level with AM labs  Sinthia Karabin A. Jeanella Craze, PharmD, BCPS, FNKF Clinical Pharmacist [place] Please utilize Amion for appropriate phone number to reach the unit pharmacist [facility])  06/02/2022 6:59 AM

## 2022-06-02 NOTE — Progress Notes (Addendum)
PROGRESS NOTE   Diane Robertson  WFU:[id number] DOB: 1928-12-13 DOA: 05/23/2022 PCP: Wynona Luna, MD  Brief Narrative:   88 year old white female Known underlying HTN, diverticulitis probable dementia Admit 05/23/2022 abdominal pain hospitalized twice in [facility] for this recently-developed worsening abdominal pain loose stool also lower leg tenderness Work-up in [facility] ED = CTA abdomen pelvis = acute segmental subsegmental PE right lower lobe of lung Inflammatory mass punctate focus of extraluminal gas distal small bowel  6/15: General surgery consulted-recommend ex lap with SB resection, palliative care consulted as above 6/16: exploratory laparotomy with small bowel resection 6/17: Postop ileus with NG tube in place 6/21: Melanotic stool, NG tube removed sips trial  Hospital-Problem based course  Inflammatory mass distal small bowel?  Diverticulitis--- pathology without malignancy ex lap/ bowel resection 6/16 Complication of postoperative ileus--NG tube DC 6/21 Melanotic stools last noted 6/21-no further stool-last stool liquid brown 6/23  Hemoglobin stable-diet graduated 6/24 soft diet heparin for anticoagulation 6/23 Pain control tramadol 50 every 8 prn-- DC morphine 6/23 Zosyn DC previously-saline lock fluids 6/24 Unexpected anemia of acute blood loss Melena probably from post anastomotic bleed S/p 05/30/2022 1 unit PRBC PPI and H2 blocker changed to p.o.  Acute subsegmental PE right lower lobe lung Bilateral DVTs lower extremity Resume heparin on 6/23--if remains stable consideration for DOAC 6/25 HTN Continue atenolol 25 daily Losartan has been discontinued--previously used hydralazine has been held Probable underlying dementia Reorient as able-does have intermittent confusion  DVT prophylaxis: SCD at this time Code Status: Full Family Communication: Called patient's niece [name] [phone] no answer left message on 6/23 Disposition:  Status is:  Inpatient Remains inpatient appropriate because:   The option of heparin and ensuring that no further bleeding Be discharged to skilled facility on 6/26 probably   Consultants:  General surgery  Procedures:   Antimicrobials:     Subjective:  Slight confusion today-thinsk she went home and came back--knows she is in hopsital-no pain Eating soft diet, had ~50% meal No cp 1 liquid browns tool yesterday per notes   Objective: Vitals:   06/01/22 0433 06/01/22 0806 06/01/22 2021 06/02/22 0738  BP: (!) 145/59 129/62 (!) 147/55 (!) 128/50  Pulse: 61 (!) 55 64 (!) 58  Resp: 16 19 18 16   Temp: 98 F (36.7 C) 98 F (36.7 C) 98.3 F (36.8 C) 99 F (37.2 C)  TempSrc: Oral  Oral Oral  SpO2: 96% 94% 97% 96%  Weight:      Height:        Intake/Output Summary (Last 24 hours) at 06/02/2022 1220 Last data filed at 06/01/2022 2330 Gross per 24 hour  Intake 3 ml  Output 650 ml  Net -647 ml    Filed Weights   05/23/22 1400  Weight: 58.1 kg    Examination:  Mild confusion Chest clear no rales rhonchi wheeze Abdomen soft with midline scar periumbilical--well healed staples in place No lower extremity edema S1-S2 no murmur telemetry shows sinus rhythm ROM is intact moving all 4 limbs relatively well   Data Reviewed: personally reviewed   CBC    Component Value Date/Time   WBC 6.7 06/02/2022 0246   RBC 2.53 (L) 06/02/2022 0246   HGB 7.4 (L) 06/02/2022 0246   HCT 23.4 (L) 06/02/2022 0246   PLT 252 06/02/2022 0246   MCV 92.5 06/02/2022 0246   MCH 29.2 06/02/2022 0246   MCHC 31.6 06/02/2022 0246   RDW 17.2 (H) 06/02/2022 0246   LYMPHSABS 1.3 05/31/2022 06/02/2022  MONOABS 0.7 05/31/2022 0313   EOSABS 0.3 05/31/2022 0313   BASOSABS 0.1 05/31/2022 0313      Latest Ref Rng & Units 06/02/2022    2:46 AM 06/01/2022    3:24 AM 05/31/2022    3:13 AM  CMP  Glucose 70 - 99 mg/dL 035  94  85   BUN 8 - 23 mg/dL 12  14  19    Creatinine 0.44 - 1.00 mg/dL  0.09  3.81    Sodium 135 - 145 mmol/L 139  140  140   Potassium 3.5 - 5.1 mmol/L 3.5  3.4  3.2   Chloride 98 - 111 mmol/L 109  111  110   CO2 22 - 32 mmol/L 23  20  22    Calcium 8.9 - 10.3 mg/dL 8.3  8.4  8.1   Total Protein 6.5 - 8.1 g/dL   4.0   Total Bilirubin 0.3 - 1.2 mg/dL   0.8   Alkaline Phos 38 - 126 U/L   67   AST 15 - 41 U/L   25   ALT 0 - 44 U/L   19      Radiology Studies: No results found.   Scheduled Meds:  atenolol  25 mg Oral Q2200   Chlorhexidine Gluconate Cloth  6 each Topical Daily   famotidine  20 mg Oral Daily   feeding supplement  1 Container Oral TID BM   pantoprazole  40 mg Oral Daily   sodium chloride flush  3 mL Intravenous Q12H   Continuous Infusions:  sodium chloride 50 mL/hr at 06/01/22 0652   heparin 900 Units/hr (06/01/22 1952)     LOS: 9 days   Time spent: 75  06/03/22, MD [facility] To contact the attending provider between 7A-7P or the covering provider during after hours 7P-7A, please log into the web site [email or web address] and access using universal [place] password for that web site. If you do not have the password, please call the hospital operator.  06/02/2022, 12:20 PM

## 2022-06-02 NOTE — Progress Notes (Signed)
Pharmacy Consult for IV Heparin Indication: pulmonary embolus and BLE DVTs  Allergies  Allergen Reactions   Bee Venom Other (See Comments)    UNK reaction    Patient Measurements: Height: 5' 3.5" (161.3 cm) Weight: 58.1 kg (128 lb) IBW/kg (Calculated) : 53.55 HEPARIN DW (KG): 58.1  Vital Signs: Temp: 98.8 F (37.1 C) (06/24 1223) Temp Source: Oral (06/24 1223) BP: 126/52 (06/24 1223) Pulse Rate: 57 (06/24 1223)  Labs: Recent Labs    05/31/22 0313 06/01/22 0324 06/01/22 0424 06/01/22 1842 06/02/22 0246 06/02/22 1425  HGB 7.3*  --  8.0*  --  7.4*  --   HCT 22.0*  --  24.9*  --  23.4*  --   PLT 236  --  247  --  252  --   HEPARINUNFRC  --   --   --  0.23* 0.49 1.09*  CREATININE 0.74 0.81  --   --  0.87  --      Estimated Creatinine Clearance: 36.4 mL/min (by C-G formula based on SCr of 0.87 mg/dL).  Assessment: Diane Robertson a 88 y.o. female presented with epigastric pain, now found to have acute segmental and subsegmental pulmonary embolus of the RLL and BLE DVTs.  Pharmacy consulted on 05/24/22 for heparin dosing. Heparin was held 6/20-6/22 due to melena and probable anastomotic bleeding per surgery. Reconsulted to start heparin 6/23.   Heparin level 1.09 (therapeutic) on current rate. Hgb 7.4 Level drawn from opposite arm. Hold heparin for 1 hour and restart at 750 units/he  Goal of Therapy:  Heparin level 0.3-0.7 units/ml Monitor platelets by anticoagulation protocol: Yes   Plan:  Hold heparin for 1 hour Decrease heparin gtt to 750 units/hr Heparin level in 8 hours F/u heparin level with AM labs  Alvester Eads A. Jeanella Craze, PharmD, BCPS, FNKF Clinical Pharmacist [place] Please utilize Amion for appropriate phone number to reach the unit pharmacist [facility])  06/02/2022 3:24 PM

## 2022-06-02 NOTE — Progress Notes (Signed)
Patient ID: Diane Robertson, female   DOB: Sep 11, 1929, 88 y.o.   MRN: [id number] [facility] Surgery Progress Note  8 Days Post-Op  Subjective: CC-  Hep gtt started yesterday, hgb 7.4 this morning from 8.0. Patient reports some abdominal discomfort this morning. No nausea or vomiting. Had a BM yesterday.  Objective: Vital signs in last 24 hours: Temp:  [98.3 F (36.8 C)-99 F (37.2 C)] 99 F (37.2 C) (06/24 0738) Pulse Rate:  [58-64] 58 (06/24 0738) Resp:  [16-18] 16 (06/24 0738) BP: (128-147)/(50-55) 128/50 (06/24 0738) SpO2:  [96 %-97 %] 96 % (06/24 0738) Last BM Date : 05/31/22  Intake/Output from previous day: 06/23 0701 - 06/24 0700 In: 243 [P.O.:240; I.V.:3] Out: 650 [Urine:650] Intake/Output this shift: No intake/output data recorded.  PE: Gen:  Alert, NAD, pleasant GI: soft, mild distension, midline incision with staples present and no cellulitis  Lab Results:  Recent Labs    06/01/22 0424 06/02/22 0246  WBC 5.9 6.7  HGB 8.0* 7.4*  HCT 24.9* 23.4*  PLT 247 252   BMET Recent Labs    06/01/22 0324 06/02/22 0246  NA 140 139  K 3.4* 3.5  CL 111 109  CO2 20* 23  GLUCOSE 94 108*  BUN 14 12  CREATININE 0.81 0.87  CALCIUM 8.4* 8.3*   PT/INR No results for input(s): "LABPROT", "INR" in the last 72 hours. CMP     Component Value Date/Time   NA 139 06/02/2022 0246   K 3.5 06/02/2022 0246   CL 109 06/02/2022 0246   CO2 23 06/02/2022 0246   GLUCOSE 108 (H) 06/02/2022 0246   BUN 12 06/02/2022 0246   CREATININE 0.87 06/02/2022 0246   CALCIUM 8.3 (L) 06/02/2022 0246   PROT 4.0 (L) 05/31/2022 0313   ALBUMIN 1.7 (L) 05/31/2022 0313   AST 25 05/31/2022 0313   ALT 19 05/31/2022 0313   ALKPHOS 67 05/31/2022 0313   BILITOT 0.8 05/31/2022 0313   GFRNONAA >60 06/02/2022 0246   Lipase  No results found for: "LIPASE"     Studies/Results: No results found.  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 Andrey Campanile - path w/out malignancy  - afebrile, VSS, WBC normal - anastomotic ozzing on hep gtt. appropriate rise in hemoglobin after 1 unit PRBCs 6/2. A couple more melanotic stools 6/22. Hgb relatively stable on heparin gtt, continue to monitor. - Soft diet and protein supplements. - continue PT/OT, mobilize - recommending SNF  ID - zosyn 6/15>>6/17 FEN - SOFT, Boost; give Kcl and Mg for goal of Mg > 2.0 and K > 4.0 VTE - SCDs, hep gtt  Acute subsegmental PE right lower lobe lung, Bilateral DVTs lower extremity HTN Dementia   LOS: 9 days    Fritzi Mandes, MD [facility] Surgery 06/02/2022, 11:12 AM Please see Amion for pager number during day hours 7:00am-4:30pm

## 2022-06-02 NOTE — Progress Notes (Signed)
PT Cancellation Note  Patient Details Name: Diane Robertson MRN: [id number] DOB: 1929/07/02   Cancelled Treatment:    Reason Eval/Treat Not Completed: Patient declined, no reason specified (Pt is refusing PT at this time.  Stating she is tired, sore and doesn't feel like it today.  PT attempts to convince her to at least do therex in bed, but pt declines.  PT to f/u as able.)  Diane Robertson A. Joleene Burnham, PT, DPT Acute Rehabilitation Services Office: [phone]  Diane Robertson A Yuko Coventry 06/02/2022, 10:18 AM

## 2022-06-02 NOTE — Progress Notes (Signed)
Mobility Specialist Progress Note   06/02/22 1320  Mobility  Activity Turned to back - supine  Level of Assistance Minimal assist, patient does 75% or more  Assistive Device  (HHA)  Activity Response Tolerated well  $Mobility charge 1 Mobility   Arrived to room w/ pt setting off bed alarm trying to sit up in bed. Assisted in repositioning pt w/o complaint. Left in chair position w/ lunch tray in front and call bell by side.  Frederico Hamman Mobility Specialist Phone Number [phone]

## 2022-06-03 LAB — CBC
HCT: 24.4 % — ABNORMAL LOW (ref 36.0–46.0)
HCT: 26 % — ABNORMAL LOW (ref 36.0–46.0)
Hemoglobin: 7.9 g/dL — ABNORMAL LOW (ref 12.0–15.0)
Hemoglobin: 8.7 g/dL — ABNORMAL LOW (ref 12.0–15.0)
MCH: 30.2 pg (ref 26.0–34.0)
MCH: 31 pg (ref 26.0–34.0)
MCHC: 32.4 g/dL (ref 30.0–36.0)
MCHC: 33.5 g/dL (ref 30.0–36.0)
MCV: 93.1 fL (ref 80.0–100.0)
MCV: 92.5 fL (ref 80.0–100.0)
Platelets: 274 10*3/uL (ref 150–400)
Platelets: 284 10*3/uL (ref 150–400)
RBC: 2.62 MIL/uL — ABNORMAL LOW (ref 3.87–5.11)
RBC: 2.81 MIL/uL — ABNORMAL LOW (ref 3.87–5.11)
RDW: 17.1 % — ABNORMAL HIGH (ref 11.5–15.5)
RDW: 16.8 % — ABNORMAL HIGH (ref 11.5–15.5)
WBC: 6.6 10*3/uL (ref 4.0–10.5)
WBC: 6.9 10*3/uL (ref 4.0–10.5)
nRBC: 0 % (ref 0.0–0.2)
nRBC: 0 % (ref 0.0–0.2)

## 2022-06-03 LAB — HEPARIN LEVEL (UNFRACTIONATED)
Heparin Unfractionated: 0.34 IU/mL (ref 0.30–0.70)
Heparin Unfractionated: 0.49 IU/mL (ref 0.30–0.70)
Heparin Unfractionated: 0.5 IU/mL (ref 0.30–0.70)

## 2022-06-03 MED ORDER — PANTOPRAZOLE SODIUM 40 MG PO TBEC
40.0000 mg | DELAYED_RELEASE_TABLET | Freq: Two times a day (BID) | ORAL | Status: DC
  Administered 2022-06-03 – 2022-06-06 (×6): 40 mg via ORAL
  Filled 2022-06-03 (×6): qty 1

## 2022-06-03 MED ORDER — APIXABAN 5 MG PO TABS: 5.0000 mg | ORAL_TABLET | Freq: Two times a day (BID) | ORAL | Status: DC

## 2022-06-03 NOTE — Progress Notes (Signed)
PROGRESS NOTE   Diane Robertson  VFI:[id number] DOB: [birth year] DOA: 05/23/2022 PCP: Wynona Luna, MD  Brief Narrative:   88 year old white female Known underlying HTN, diverticulitis probable dementia Admit 05/23/2022 abdominal pain hospitalized twice in [facility] for this recently-developed worsening abdominal pain loose stool also lower leg tenderness Work-up in [facility] ED = CTA abdomen pelvis = acute segmental subsegmental PE right lower lobe of lung Inflammatory mass punctate focus of extraluminal gas distal small bowel  6/15: General surgery consulted-recommend ex lap with SB resection, palliative care consulted as above 6/16: exploratory laparotomy with small bowel resection 6/17: Postop ileus with NG tube in place 6/21: Melanotic stool, NG tube removed sips trial  Hospital-Problem based course  Inflammatory mass distal small bowel?  Diverticulitis--- pathology without malignancy ex lap/ bowel resection 6/16 Complication of postoperative ileus--NG tube DC 6/21 Pain control tramadol 50 every 8 prn-- DC morphine 6/23 Zosyn DC previously-saline lock fluids 6/24 Unexpected anemia of acute blood loss Melena probably from post anastomotic bleed S/p 05/30/2022 1 unit PRBC PPI p.o. twice daily and H2 blocker changed to p.o.  Melanotic stools last noted 6/25 unformed in appearance but paradoxically hemoglobin is higher elevated at 8.7 so this is not an active bleed diet graduated 6/24 soft diet heparin held for 24 hours but probably will start straight back on DOAC in a.m. Acute subsegmental PE right lower lobe lung Bilateral DVTs lower extremity consideration for DOAC 6/25 HTN Continue atenolol 25 daily Losartan has been discontinued--previously used hydralazine has been held Probable underlying dementia Reorient as able-does have intermittent confusion  DVT prophylaxis: SCD at this time Code Status: Full Family Communication: Called patient's niece [name]  [phone] on 6/25 and updated fully Disposition:  Status is: Inpatient Remains inpatient appropriate because:   Likely skilled facility on 6/27 if hemoglobin remains stable   Consultants:  General surgery  Procedures:   Antimicrobials:     Subjective:  Doing fair no distress On exam today she has hematuria and when I examined her GU GI area she was noted to have dark stool-this is somewhat unformed  Objective: Vitals:   06/03/22 0150 06/03/22 0611 06/03/22 0744 06/03/22 1210  BP: (!) 145/51 (!) 152/57  (!) 132/54  Pulse: 64 61    Resp: 16 18    Temp: 98.5 F (36.9 C) 98.4 F (36.9 C)  98.6 F (37 C)  TempSrc:    Oral  SpO2: 94% 99% 98%   Weight:      Height:        Intake/Output Summary (Last 24 hours) at 06/03/2022 1340 Last data filed at 06/03/2022 0913 Gross per 24 hour  Intake 3 ml  Output 700 ml  Net -697 ml    Filed Weights   05/23/22 1400  Weight: 58.1 kg    Examination:  Mild confusion Chest clear no rales rhonchi wheeze Abdomen soft with midline scar -- staples in place No lower extremity edema S1-S2 no murmur telemetry shows sinus rhythm ROM is intact moving all 4 limbs relatively well   Data Reviewed: personally reviewed   CBC    Component Value Date/Time   WBC 6.9 06/03/2022 1036   RBC 2.81 (L) 06/03/2022 1036   HGB 8.7 (L) 06/03/2022 1036   HCT 26.0 (L) 06/03/2022 1036   PLT 284 06/03/2022 1036   MCV 92.5 06/03/2022 1036   MCH 31.0 06/03/2022 1036   MCHC 33.5 06/03/2022 1036   RDW 16.8 (H) 06/03/2022 1036   LYMPHSABS 1.3 05/31/2022 0313  MONOABS 0.7 05/31/2022 0313   EOSABS 0.3 05/31/2022 0313   BASOSABS 0.1 05/31/2022 0313      Latest Ref Rng & Units 06/02/2022    2:46 AM 06/01/2022    3:24 AM 05/31/2022    3:13 AM  CMP  Glucose 70 - 99 mg/dL 778  94  85   BUN 8 - 23 mg/dL 12  14  19    Creatinine 0.44 - 1.00 mg/dL  2.42  3.53   Sodium 135 - 145 mmol/L 139  140  140   Potassium 3.5 - 5.1 mmol/L 3.5  3.4  3.2    Chloride 98 - 111 mmol/L 109  111  110   CO2 22 - 32 mmol/L 23  20  22    Calcium 8.9 - 10.3 mg/dL 8.3  8.4  8.1   Total Protein 6.5 - 8.1 g/dL   4.0   Total Bilirubin 0.3 - 1.2 mg/dL   0.8   Alkaline Phos 38 - 126 U/L   67   AST 15 - 41 U/L   25   ALT 0 - 44 U/L   19      Radiology Studies: No results found.   Scheduled Meds:  atenolol  25 mg Oral Q2200   Chlorhexidine Gluconate Cloth  6 each Topical Daily   famotidine  20 mg Oral Daily   feeding supplement  1 Container Oral TID BM   pantoprazole  40 mg Oral BID   sodium chloride flush  3 mL Intravenous Q12H   Continuous Infusions:     LOS: 10 days   Time spent: 57  , MD [facility] To contact the attending provider between 7A-7P or the covering provider during after hours 7P-7A, please log into the web site [email or web address] and access using universal [place] password for that web site. If you do not have the password, please call the hospital operator.  06/03/2022, 1:40 PM

## 2022-06-03 NOTE — Progress Notes (Signed)
ANTICOAGULATION CONSULT NOTE - Follow Up Consult  Pharmacy Consult for heparin Indication:  PE/DVT  Labs: Recent Labs    05/31/22 0313 06/01/22 0324 06/01/22 0424 06/01/22 1842 06/02/22 0246 06/02/22 1425 06/03/22 0025  HGB 7.3*  --  8.0*  --  7.4*  --   --   HCT 22.0*  --  24.9*  --  23.4*  --   --   PLT 236  --  247  --  252  --   --   HEPARINUNFRC  --   --   --    < > 0.49 1.09* 0.50  CREATININE 0.74 0.81  --   --  0.87  --   --    < > = values in this interval not displayed.    Assessment/Plan:  88yo female therapeutic on heparin after rate change. Will continue infusion at current rate of 750 units/hr and confirm stable with am labs.   Vernard Gambles, PharmD, BCPS  06/03/2022,1:13 AM

## 2022-06-03 NOTE — Progress Notes (Signed)
Palliative Medicine Inpatient Follow Up Note  HPI: Patient is a pleasant 88 year old female history of dementia, hypertension, diverticulitis presented to the ED with abdominal pain.  Patient noted to be a poor historian.  Patient with complaints of generalized abdominal pain for at least a month noted to have been hospitalized twice recently and treated for acute diverticulitis but worsening pain in recent days.  Patient on arrival in the ED noted to be afebrile, sats of mid to upper 90s on room air.  CT angiogram of abdomen and pelvis done noted for a acute segmental and subsegmental PE within the right lower lobe and inflammatory mass with punctate focus of extraluminal gas at the distal small bowel.  General surgery was consulted.  Patient placed empirically on IV heparin and patient transferred to [facility]. Palliative care has been asked to get involved to further address goals of care.  Today's Discussion 06/03/2022  *Please note that this is a verbal dictation therefore any spelling or grammatical errors are due to the "[place] [employer]" system interpretation.  Chart reviewed inclusive of vital signs, progress notes, laboratory results, and diagnostic images. POD#9 of a small bowel resection.   I met with Lelan Pons at bedside this morning.  She denies abdominal pain this morning. We review that she had just received tramadol which does appear to be keeping her symptoms at [place]. Lelan Pons expresses willingness to mobilize. Regarding appetite, Lelan Pons shares she has very little desire to eat though is willing to take artificial nutritional supplements like "boost". Reviewed concerns related to bleeding per hospitalist and pharmacy notes. Lelan Pons is thankful for this information.   I called patients niece, Edd Fabian and provided an update from a Palliative perspective. Edd Fabian asks about the plan for discharge. We reviewed that this will be up to the Hospitalist who I will reach out to for her. She and I  discussed the plan moving forward in terms of care for [facility]. Edd Fabian shared some additional stressors that are affecting her in terms of other family members. We were able to talk through some of her anxieties. Questions and concerns addressed. Palliative Support Provided.   Objective Assessment: Vital Signs Vitals:   06/03/22 0150 06/03/22 0611  BP: (!) 145/51 (!) 152/57  Pulse: 64 61  Resp: 16 18  Temp: 98.5 F (36.9 C) 98.4 F (36.9 C)  SpO2: 94% 99%    Intake/Output Summary (Last 24 hours) at 06/03/2022 1037 Last data filed at 06/03/2022 0913 Gross per 24 hour  Intake 3 ml  Output 700 ml  Net -697 ml    Last Weight  Most recent update: 05/23/2022  2:01 PM    Weight  58.1 kg (128 lb)            Gen: Elderly Caucasian female in mild distress HEENT: moist mucous membranes CV: Regular rate and rhythm  PULM: clear to auscultation bilaterally  ABD: Tender right upper mid abdomen EXT: No edema Neuro: Alert and oriented x1-2  SUMMARY OF RECOMMENDATIONS   DNAR/DNI   POD #9 SBR --> doing well though has had some melena requiring anticoagulants to be placed on hold  Decreased PO intake --> Will continue to monitor this diligently, education provided on the importance of supplement drinks  Goals are for improvement/recovery  Continue to mobilize daily  Pain is controlled with Tramadol   Chaplain involvement    Ongoing incremental Palliative support  Plan for SNF transfer once medically optimized  MDM: Moderate  ______________________________________________________________________________________ Tacey Ruiz Dante Palliative  Medicine Team Team Cell Phone: [phone] Please utilize secure chat with additional questions, if there is no response within 30 minutes please call the above phone number  Palliative Medicine Team providers are available by phone from 7am to 7pm daily and can be reached through the team cell phone.  Should this patient require  assistance outside of these hours, please call the patient's attending physician.

## 2022-06-03 NOTE — Progress Notes (Signed)
Patient ID: CLARISSA LAIRD, female   DOB: January 02, 1929, 88 y.o.   MRN: [id number] [facility] Surgery Progress Note  9 Days Post-Op  Subjective: CC-  Hep gtt started 6/23, hgb 7.9 from 7.4. Some melena noted. Patient reports some abdominal soreness. Denies n/v.  Objective: Vital signs in last 24 hours: Temp:  [98.1 F (36.7 C)-98.8 F (37.1 C)] 98.4 F (36.9 C) (06/25 0611) Pulse Rate:  [57-84] 61 (06/25 0611) Resp:  [15-18] 18 (06/25 0611) BP: (126-152)/(51-63) 152/57 (06/25 0611) SpO2:  [94 %-99 %] 99 % (06/25 0611) Last BM Date : 05/31/22  Intake/Output from previous day: 06/24 0701 - 06/25 0700 In: 3 [I.V.:3] Out: 500 [Urine:500] Intake/Output this shift: Total I/O In: -  Out: 200 [Urine:200]  PE: Gen:  Alert, NAD, pleasant GI: soft, mild distension, midline incision with staples present and no cellulitis, +BS  Lab Results:  Recent Labs    06/03/22 0319 06/03/22 1036  WBC 6.6 6.9  HGB 7.9* 8.7*  HCT 24.4* 26.0*  PLT 274 284    BMET Recent Labs    06/01/22 0324 06/02/22 0246  NA 140 139  K 3.4* 3.5  CL 111 109  CO2 20* 23  GLUCOSE 94 108*  BUN 14 12  CREATININE 0.81 0.87  CALCIUM 8.4* 8.3*    PT/INR No results for input(s): "LABPROT", "INR" in the last 72 hours. CMP     Component Value Date/Time   NA 139 06/02/2022 0246   K 3.5 06/02/2022 0246   CL 109 06/02/2022 0246   CO2 23 06/02/2022 0246   GLUCOSE 108 (H) 06/02/2022 0246   BUN 12 06/02/2022 0246   CREATININE 0.87 06/02/2022 0246   CALCIUM 8.3 (L) 06/02/2022 0246   PROT 4.0 (L) 05/31/2022 0313   ALBUMIN 1.7 (L) 05/31/2022 0313   AST 25 05/31/2022 0313   ALT 19 05/31/2022 0313   ALKPHOS 67 05/31/2022 0313   BILITOT 0.8 05/31/2022 0313   GFRNONAA >60 06/02/2022 0246   Lipase  No results found for: "LIPASE"     Studies/Results: No results found.  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 Andrey Campanile - path w/out malignancy  - afebrile, VSS, WBC normal - anastomotic ozzing on hep gtt. appropriate rise in hemoglobin after 1 unit PRBCs 6/2. A couple more melanotic stools 6/22. Hgb relatively stable on heparin gtt, but had another melanotic stool. - increase PPI to BID, will discuss with MD  - Soft diet and protein supplements. - continue PT/OT, mobilize - recommending SNF  ID - zosyn 6/15>>6/17 FEN - SOFT, Boost; give Kcl and Mg for goal of Mg > 2.0 and K > 4.0 VTE - SCDs, hep gtt  Acute subsegmental PE right lower lobe lung, Bilateral DVTs lower extremity HTN Dementia   LOS: 10 days    Juliet Rude, MD [facility] Surgery 06/03/2022, 11:34 AM Please see Amion for pager number during day  hours 7:00am-4:30pm

## 2022-06-03 NOTE — Progress Notes (Addendum)
Pharmacy Consult for IV Heparin>>apixaban Indication: pulmonary embolus and BLE DVTs  Allergies  Allergen Reactions   Bee Venom Other (See Comments)    UNK reaction    Patient Measurements: Height: 5' 3.5" (161.3 cm) Weight: 58.1 kg (128 lb) IBW/kg (Calculated) : 53.55 HEPARIN DW (KG): 58.1  Vital Signs: Temp: 98.4 F (36.9 C) (06/25 0611) BP: 152/57 (06/25 0611) Pulse Rate: 61 (06/25 0611)  Labs: Recent Labs     0000 06/01/22 0324 06/01/22 0424 06/01/22 1842 06/02/22 0246 06/02/22 1425 06/03/22 0025 06/03/22 0319 06/03/22 0924  HGB   < >  --  8.0*  --  7.4*  --   --  7.9*  --   HCT  --   --  24.9*  --  23.4*  --   --  24.4*  --   PLT  --   --  247  --  252  --   --  274  --   HEPARINUNFRC  --   --   --    < > 0.49   < > 0.50 0.34 0.49  CREATININE  --  0.81  --   --  0.87  --   --   --   --    < > = values in this interval not displayed.     Estimated Creatinine Clearance: 36.4 mL/min (by C-G formula based on SCr of 0.87 mg/dL).  Assessment: Diane Robertson a 88 y.o. female presented with epigastric pain, now found to have acute segmental and subsegmental pulmonary embolus of the RLL and BLE DVTs.  Pharmacy consulted on 05/24/22 for heparin dosing. Heparin was held 6/20-6/22 due to melena and probable anastomotic bleeding per surgery. Reconsulted to start heparin 6/23.   Patient having active bleed per MD. Will hold heparin and not give apixaban at this time.    Goal of Therapy:  Monitor platelets by anticoagulation protocol: Yes   Plan:  D/C heparin  Hold Apixaban 5mg  BID F/u CBC and bleeding  Abigail Marsiglia A. , PharmD, BCPS, FNKF Clinical Pharmacist [place] Please utilize Amion for appropriate phone number to reach the unit pharmacist [facility])  06/03/2022 10:11 AM

## 2022-06-04 LAB — BASIC METABOLIC PANEL
Anion gap: 9 (ref 5–15)
BUN: 11 mg/dL (ref 8–23)
CO2: 23 mmol/L (ref 22–32)
Calcium: 8.4 mg/dL — ABNORMAL LOW (ref 8.9–10.3)
Chloride: 107 mmol/L (ref 98–111)
Creatinine, Ser: 0.95 mg/dL (ref 0.44–1.00)
GFR, Estimated: 57 mL/min — ABNORMAL LOW (ref >60)
Glucose, Bld: 99 mg/dL (ref 70–99)
Potassium: 3.8 mmol/L (ref 3.5–5.1)
Sodium: 139 mmol/L (ref 135–145)

## 2022-06-04 LAB — CBC
HCT: 24.7 % — ABNORMAL LOW (ref 36.0–46.0)
Hemoglobin: 8 g/dL — ABNORMAL LOW (ref 12.0–15.0)
MCH: 30.8 pg (ref 26.0–34.0)
MCHC: 32.4 g/dL (ref 30.0–36.0)
MCV: 95 fL (ref 80.0–100.0)
Platelets: 284 10*3/uL (ref 150–400)
RBC: 2.6 MIL/uL — ABNORMAL LOW (ref 3.87–5.11)
RDW: 16.6 % — ABNORMAL HIGH (ref 11.5–15.5)
WBC: 6.8 10*3/uL (ref 4.0–10.5)
nRBC: 0 % (ref 0.0–0.2)

## 2022-06-04 LAB — HEPARIN LEVEL (UNFRACTIONATED): Heparin Unfractionated: <0.1 IU/mL — ABNORMAL LOW (ref 0.30–0.70)

## 2022-06-04 MED ORDER — APIXABAN 5 MG PO TABS
5.0000 mg | ORAL_TABLET | Freq: Two times a day (BID) | ORAL | Status: DC
Start: 2022-06-04 — End: 2022-06-04

## 2022-06-04 MED ORDER — APIXABAN 5 MG PO TABS: 5.0000 mg | ORAL_TABLET | Freq: Two times a day (BID) | ORAL | Status: DC

## 2022-06-04 MED ORDER — APIXABAN 5 MG PO TABS
5.0000 mg | ORAL_TABLET | Freq: Two times a day (BID) | ORAL | Status: DC
  Administered 2022-06-04 – 2022-06-06 (×5): 5 mg via ORAL
  Filled 2022-06-04 (×5): qty 1

## 2022-06-04 MED ORDER — ADULT MULTIVITAMIN W/MINERALS CH
1.0000 | ORAL_TABLET | Freq: Every day | ORAL | Status: DC
  Administered 2022-06-04 – 2022-06-06 (×3): 1 via ORAL
  Filled 2022-06-04 (×3): qty 1

## 2022-06-04 MED ORDER — APIXABAN 5 MG PO TABS: 10.0000 mg | ORAL_TABLET | Freq: Two times a day (BID) | ORAL | Status: DC

## 2022-06-04 MED ORDER — BOOST PLUS PO LIQD
237.0000 mL | Freq: Three times a day (TID) | ORAL | Status: DC
  Administered 2022-06-04 – 2022-06-06 (×5): 237 mL via ORAL
  Filled 2022-06-04 (×7): qty 237

## 2022-06-04 NOTE — Progress Notes (Signed)
Patient ID: ZAIDEE RION, female   DOB: [birth year], 88 y.o.   MRN: [id number] [facility] Surgery Progress Note  10 Days Post-Op  Subjective: CC- abd pain, feeling cold   Patient reports abdominal pain - points to lower aspect of her incision and her LLQ. She has not had breakfast and says she is not hungry. She did say she would drink ensure and eat yogurt if offered to her but does not want her eggs/sausage. Denies flatus. Thinks she had a BM yesterday. States her niece is supposed to visit tomorrow and drive her to a nursing facility.  Objective: Vital signs in last 24 hours: Temp:  [98.6 F (37 C)] 98.6 F (37 C) (06/25 1210) Pulse Rate:  [60-70] 60 (06/26 0637) Resp:  [18] 18 (06/25 2030) BP: (127-141)/(53-60) 127/59 (06/26 0637) SpO2:  [94 %-95 %] 94 % (06/26 0637) Last BM Date : 05/31/22  Intake/Output from previous day: 06/25 0701 - 06/26 0700 In: -  Out: 200 [Urine:200] Intake/Output this shift: No intake/output data recorded.  PE: Gen:  Alert, NAD, pleasant GI: soft, overall non-distended distension,  TTP LLQ without guarding, without peritonitis. Staples c/d/I without cellulitis or drainage.  Lab Results:  Recent Labs    06/03/22 1036 06/04/22 0513  WBC 6.9 6.8  HGB 8.7* 8.0*  HCT 26.0* 24.7*  PLT 284 284   BMET Recent Labs    06/02/22 0246 06/04/22 0513  NA 139 139  K 3.5 3.8  CL 109 107  CO2 23 23  GLUCOSE 108* 99  BUN 12 11  CREATININE 0.87 0.95  CALCIUM 8.3* 8.4*   PT/INR No results for input(s): "LABPROT", "INR" in the last 72 hours. CMP     Component Value Date/Time   NA 139 06/04/2022 0513   K 3.8 06/04/2022 0513   CL 107 06/04/2022 0513   CO2 23 06/04/2022 0513   GLUCOSE 99 06/04/2022 0513   BUN 11 06/04/2022 0513   CREATININE 0.95 06/04/2022 0513   CALCIUM 8.4 (L) 06/04/2022 0513   PROT 4.0 (L) 05/31/2022 0313   ALBUMIN 1.7 (L) 05/31/2022 0313   AST 25 05/31/2022 0313   ALT 19 05/31/2022 0313   ALKPHOS 67 05/31/2022  0313   BILITOT 0.8 05/31/2022 0313   GFRNONAA 57 (L) 06/04/2022 0513   Lipase  No results found for: "LIPASE"     Studies/Results: No results found.  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 Andrey Campanile - path w/out malignancy  - afebrile, VSS, WBC normal - anastomotic ozzing on hep gtt. appropriate rise in hemoglobin after 1 unit PRBCs 6/21. A couple more melanotic stools 6/22. Hgb relatively stable on heparin gtt, but had another melanotic stool and hep gtt held. hgb trend has been 7.9 > 8.7 > 8.0 this AM. TRH has ordered apixiban to start today. Agree with this plan. Given overall stability of hgb without need for additional transfusion in last 5  days, and acute nature of DVT/PE, I think restarting DOAC is roasonable but would monitor the patients BMs and CBC for 24-48h before discharge to SNF. - increased PPI to BID on 6/25 - Soft diet and protein supplements. - continue PT/OT, mobilize - recommending SNF - I will make the patients follow up appointment. Plan for D/C staples POD#14.  ID - zosyn 6/15>>6/17 FEN - SOFT, Boost; give Kcl and Mg for goal of Mg > 2.0 and K > 4.0 VTE - SCDs, hep gtt  Acute subsegmental PE right lower lobe lung, Bilateral DVTs lower extremity HTN Dementia   LOS: 11 days    Adam Phenix, MD [facility] Surgery 06/04/2022, 9:11 AM Please see Amion for pager number during day hours 7:00am-4:30pm

## 2022-06-04 NOTE — Progress Notes (Signed)
PROGRESS NOTE   Diane Robertson  WPV:[id number] DOB: 18-Dec-1928 DOA: 05/23/2022 PCP: Wynona Luna, MD  Brief Narrative:   88 year old white female Known underlying HTN, diverticulitis probable dementia Admit 05/23/2022 abdominal pain hospitalized twice in [facility] for this recently-developed worsening abdominal pain loose stool also lower leg tenderness Work-up in [facility] ED = CTA abdomen pelvis = acute segmental subsegmental PE right lower lobe of lung Inflammatory mass punctate focus of extraluminal gas distal small bowel  6/15: General surgery consulted-recommend ex lap with SB resection, palliative care consulted as above 6/16: exploratory laparotomy with small bowel resection 6/17: Postop ileus with NG tube in place 6/21: Melanotic stool, NG tube removed sips trial  Hospital-Problem based course  Inflammatory mass distal small bowel?  Diverticulitis--- pathology without malignancy ex lap/ bowel resection 6/16 Complication of postoperative ileus--NG tube DC 6/21 Pain control tramadol 50 every 8 prn-- DC morphine 6/23 Zosyn DC previously-saline lock fluids 6/24 Unexpected anemia of acute blood loss Melena probably from post anastomotic bleed S/p 05/30/2022 1 unit PRBC PPI p.o. twice daily and H2 blocker changed to p.o.  Melanotic stools last noted 6/25 unformed in appearance hemoglobin has been stable for the past several days and has not dropped DOAC status 6/25 diet graduated 6/24 soft diet Acute subsegmental PE right lower lobe lung Bilateral DVTs lower extremity Will use apixaban 5 twice daily and monitor for bleeding HTN Continue atenolol 25 daily Losartan has been discontinued--previously used hydralazine has been held Probable underlying dementia Reorient as able-does have intermittent confusion  DVT prophylaxis: SCD at this time Code Status: Full Family Communication: Called patient's niece [name] [phone] on 6/25 and updated fully Disposition:   Status is: Inpatient Remains inpatient appropriate because:   Likely skilled facility on 6/27 if hemoglobin remains stable   Consultants:  General surgery  Procedures:   Antimicrobials:     Subjective:  Intermittently confused No other new concerns for dark or tarry stool No chest pain not really hungry   Objective: Vitals:   06/03/22 1619 06/03/22 2029 06/03/22 2030 06/04/22 0637  BP: (!) 141/53 139/60 139/60 (!) 127/59  Pulse:  70 70 60  Resp:   18   Temp:      TempSrc:   Oral   SpO2:  95%  94%  Weight:      Height:        Intake/Output Summary (Last 24 hours) at 06/04/2022 1004 Last data filed at 06/04/2022 0900 Gross per 24 hour  Intake 120 ml  Output 200 ml  Net -80 ml    Filed Weights   05/23/22 1400  Weight: 58.1 kg    Examination:  Awake coherent no distress no icterus no pallor S1-S2 no murmur no rub no gallop ROM is intact moving 4 limbs equally Staples on front of abdomen she seems less distended than yesterday No lower extremity edema ROM intact neurologically intact   Data Reviewed: personally reviewed   CBC    Component Value Date/Time   WBC 6.8 06/04/2022 0513   RBC 2.60 (L) 06/04/2022 0513   HGB 8.0 (L) 06/04/2022 0513   HCT 24.7 (L) 06/04/2022 0513   PLT 284 06/04/2022 0513   MCV 95.0 06/04/2022 0513   MCH 30.8 06/04/2022 0513   MCHC 32.4 06/04/2022 0513   RDW 16.6 (H) 06/04/2022 0513   LYMPHSABS 1.3 05/31/2022 0313   MONOABS 0.7 05/31/2022 0313   EOSABS 0.3 05/31/2022 0313   BASOSABS 0.1 05/31/2022 0313      Latest  Ref Rng & Units 06/04/2022    5:13 AM 06/02/2022    2:46 AM 06/01/2022    3:24 AM  CMP  Glucose 70 - 99 mg/dL 99  992  94   BUN 8 - 23 mg/dL 11  12  14    Creatinine 0.44 - 1.00 mg/dL  7.80  0.44   Sodium 135 - 145 mmol/L 139  139  140   Potassium 3.5 - 5.1 mmol/L 3.8  3.5  3.4   Chloride 98 - 111 mmol/L 107  109  111   CO2 22 - 32 mmol/L 23  23  20    Calcium 8.9 - 10.3 mg/dL 8.4  8.3  8.4       Radiology Studies: No results found.   Scheduled Meds:  apixaban  5 mg Oral BID   atenolol  25 mg Oral Q2200   Chlorhexidine Gluconate Cloth  6 each Topical Daily   famotidine  20 mg Oral Daily   feeding supplement  1 Container Oral TID BM   pantoprazole  40 mg Oral BID   sodium chloride flush  3 mL Intravenous Q12H   Continuous Infusions:     LOS: 11 days   Time spent: 47  , MD [facility] To contact the attending provider between 7A-7P or the covering provider during after hours 7P-7A, please log into the web site [email or web address] and access using universal [place] password for that web site. If you do not have the password, please call the hospital operator.  06/04/2022, 10:04 AM

## 2022-06-04 NOTE — Progress Notes (Signed)
This chaplain attempted F/U spiritual care with the Pt.   The chaplain understands from the check in with RN-Melynn, the Pt. just finished PT and is waiting for pain medicine to offer some relief. The Pt. is able to greet the chaplain and share an update on her cat and then quickly returns to a place of discomfort. The Pt. and chaplain agreed now is not a good time for a visit.  Chaplain Stephanie Acre [phone]

## 2022-06-04 NOTE — Progress Notes (Signed)
Pharmacy Consult for IV Heparin>>apixaban Indication: pulmonary embolus and BLE DVTs  Allergies  Allergen Reactions   Bee Venom Other (See Comments)    UNK reaction    Patient Measurements: Height: 5' 3.5" (161.3 cm) Weight: 58.1 kg (128 lb) IBW/kg (Calculated) : 53.55   Vital Signs: Temp Source: Oral (06/25 2030) BP: 127/59 (06/26 0637) Pulse Rate: 60 (06/26 0637)  Labs: Recent Labs    06/02/22 0246 06/02/22 1425 06/03/22 0319 06/03/22 0924 06/03/22 1036 06/04/22 0513  HGB 7.4*  --  7.9*  --  8.7* 8.0*  HCT 23.4*  --  24.4*  --  26.0* 24.7*  PLT 252  --  274  --  284 284  HEPARINUNFRC 0.49   < > 0.34 0.49  --  <0.10*  CREATININE 0.87  --   --   --   --  0.95   < > = values in this interval not displayed.     Estimated Creatinine Clearance: 33.3 mL/min (by C-G formula based on SCr of 0.95 mg/dL).  Assessment: Diane Robertson a 88 y.o. female presented with epigastric pain, now found to have acute segmental and subsegmental pulmonary embolus of the RLL and BLE DVTs.  Pharmacy consulted on 05/23/22 for heparin dosing. Heparin was held 6/20-6/22 due to melena and probable anastomotic bleeding per surgery. Reconsulted to start heparin 6/23.   Patient had active bleeding per MD (6/25). Heparin was held X 24 hours. Received consult to restart apixaban on 6/26. Restarting at the lower (5 mg) maintenance dose given she has received 7 days of heparin (6/14 through 6/20) and has had intermittent bleeding issues which have seemed to stabilize.     Goal of Therapy:  Monitor platelets by anticoagulation protocol: Yes   Plan:  D/C heparin  restart Apixaban 5mg  BID F/u CBC and and new bleeding issues that may arise.   Pharmacy will sign off consult but continue to monitor in the background and make recommendations prn  Alano Blasco BS, PharmD, BCPS Clinical Pharmacist 06/04/2022 8:20 AM  Contact: [phone] after 3 PM  "Be curious, not judgmental..." -[phone]

## 2022-06-04 NOTE — TOC Progression Note (Addendum)
Transition of Care [facility]) - Progression Note    Patient Details  Name: TAHARI CLABAUGH MRN: [id number] Date of Birth: July 30, 1929  Transition of Care [facility]) CM/SW Contact  Carley Hammed, [place] Phone Number: 06/04/2022, 10:37 AM  Clinical Narrative:    CSW was notified by [facility] that they will need updated PT notes. They also stated they will need pt's MD's name and number as it may result in a peer to peer. CSW updated medical team and facility. PT to see today, notes to be uploaded. TOC will continue to follow.  Documentation uploaded, will continue to follow for DC needs.  2:45PM Pt approved for SNF, will transfer to [facility] tomorrow. Facility updated. [id number] 06/04/2022-06/06/2022  Expected Discharge Plan: Skilled Nursing Facility Barriers to Discharge: Continued Medical Work up  Expected Discharge Plan and Services Expected Discharge Plan: Skilled Nursing Facility In-house Referral: Clinical Social Work     Living arrangements for the past 2 months: Single Family Home                                       Social Determinants of Health (SDOH) Interventions    Readmission Risk Interventions     No data to display

## 2022-06-04 NOTE — Progress Notes (Signed)
Nutrition Follow-up  DOCUMENTATION CODES:   Severe malnutrition in context of chronic illness  INTERVENTION:   Boost Plus PO TID, each supplement provides 360 kcal and 14 gm protein. MVI with minerals daily.  NUTRITION DIAGNOSIS:   Severe Malnutrition related to chronic illness (dementia) as evidenced by severe muscle depletion, severe fat depletion.  Ongoing   GOAL:   Patient will meet greater than or equal to 90% of their needs  Progressing   MONITOR:   PO intake, Supplement acceptance  REASON FOR ASSESSMENT:   Consult Assessment of nutrition requirement/status  ASSESSMENT:   88 yo female admitted with abdominal pain. PMH includes HTN, dementia, diverticulitis.  Patient is now on a soft diet. Meal intakes recorded at 0-100%. Patient states she is eating okay. Labs and medications reviewed. Plans for d/c to SNF tomorrow.  RD to add PO supplements to maximize oral intake of protein and calories.   Diet Order:   Diet Order             DIET SOFT Room service appropriate? Yes; Fluid consistency: Thin  Diet effective now                   EDUCATION NEEDS:   No education needs have been identified at this time  Skin:  Skin Assessment: Reviewed RN Assessment  Last BM:  6/25 type 6  Height:   Ht Readings from Last 1 Encounters:  05/23/22 5' 3.5" (1.613 m)    Weight:   Wt Readings from Last 1 Encounters:  05/23/22 58.1 kg     BMI:  Body mass index is 22.32 kg/m.  Estimated Nutritional Needs:   Kcal:  1700-1900  Protein:  80-95 gm  Fluid:  1.7-1.9 L    Gabriel Rainwater RD, LDN, CNSC Please refer to Amion for contact information.

## 2022-06-04 NOTE — Progress Notes (Signed)
Physical Therapy Treatment Patient Details Name: Diane Robertson MRN: [id number] DOB: Dec 17, 1928 Today's Date: 06/04/2022   History of Present Illness Pt is a 88yo F admitted on 6/4 with c/o abdominal pain. Found to have bilateral PEs and small bowel ostruction. S/p small bowel resection on 6/16. Pt with 2 recent admission for diverticulitis. PMH: Dementia, HTN, diverticulitis.    PT Comments    Pt progressing towards her physical therapy goals. Session focused on bed mobility, transfers, and ambulation. Pt overall requiring min assist for functional mobility. Ambulating ~5 ft with a walker and close chair follow. Pt reporting dizziness; BP noted below. Pt displays generalized weakness, impaired standing balance, decreased activity tolerance and cognitive impairments (baseline). Presents as a high fall risk based on decreased gait speed. Will benefit from ST SNF at d/c to address deficits and maximize functional mobility.   Vitals: (RN notified) Sitting in chair post toileting: 87/68 (73) Sitting in chair x 2 minutes: 101/49 (66) Sitting in chair post ambulation: 93/33 (45) Sitting in chair reclined: 107/47 (65)   Recommendations for follow up therapy are one component of a multi-disciplinary discharge planning process, led by the attending physician.  Recommendations may be updated based on patient status, additional functional criteria and insurance authorization.  Follow Up Recommendations  Skilled nursing-short term rehab (<3 hours/day) Can patient physically be transported by private vehicle: Yes   Assistance Recommended at Discharge Frequent or constant Supervision/Assistance  Patient can return home with the following A little help with walking and/or transfers;A lot of help with bathing/dressing/bathroom;Assistance with cooking/housework;Assist for transportation;Help with stairs or ramp for entrance   Equipment Recommendations  None recommended by PT    Recommendations for  Other Services       Precautions / Restrictions Precautions Precautions: Fall;Other (comment) Precaution Comments: watch BP, abdominal incision, stress incontinence Restrictions Weight Bearing Restrictions: No     Mobility  Bed Mobility Overal bed mobility: Needs Assistance Bed Mobility: Supine to Sit     Supine to sit: Min assist     General bed mobility comments: Cues for initiation, reaching with LUE for rail, minA at trunk to pull up to sitting position    Transfers Overall transfer level: Needs assistance Equipment used: Rolling walker (2 wheels), None Transfers: Sit to/from Stand, Bed to chair/wheelchair/BSC Sit to Stand: Min assist Stand pivot transfers: Min assist         General transfer comment: MinA to rise and steady and perform stand pivot transfer to [facility] and then recliner    Ambulation/Gait Ambulation/Gait assistance: Min guard, +2 safety/equipment Gait Distance (Feet): 5 Feet Assistive device: Rolling walker (2 wheels) Gait Pattern/deviations: Step-through pattern, Decreased stride length, Shuffle, Trunk flexed Gait velocity: decreased Gait velocity interpretation: <1.8 ft/sec, indicate of risk for recurrent falls   General Gait Details: Pt ambulating ~5 ft with walker and min guard assist for balance, chair follow utilized, further distance limited due to urinary incontinence   Stairs             Wheelchair Mobility    Modified Rankin (Stroke Patients Only)       Balance Overall balance assessment: Needs assistance Sitting-balance support: Feet supported Sitting balance-Leahy Scale: Good     Standing balance support: Bilateral upper extremity supported Standing balance-Leahy Scale: Poor Standing balance comment: reliant on RW                            Cognition Arousal/Alertness: Awake/alert Behavior During Therapy:  WFL for tasks assessed/performed Overall Cognitive Status: History of cognitive impairments - at  baseline                                 General Comments: Needs redirection to sustain attention to task when performing therapeutic exercises        Exercises General Exercises - Lower Extremity Ankle Circles/Pumps: Both, 10 reps, Seated Long Arc Quad: Both, 10 reps, Seated    General Comments        Pertinent Vitals/Pain Pain Assessment Pain Assessment: Faces Faces Pain Scale: Hurts even more Pain Location: R glute med Pain Descriptors / Indicators: Tender, Grimacing, Guarding Pain Intervention(s): Limited activity within patient's tolerance, Monitored during session, Other (comment), Repositioned (RN notified)    Home Living                          Prior Function            PT Goals (current goals can now be found in the care plan section) Acute Rehab PT Goals Patient Stated Goal: to go to rehab and get stronger Potential to Achieve Goals: Good    Frequency    Min 3X/week      PT Plan Current plan remains appropriate    Co-evaluation              AM-PAC PT "6 Clicks" Mobility   Outcome Measure  Help needed turning from your back to your side while in a flat bed without using bedrails?: A Little Help needed moving from lying on your back to sitting on the side of a flat bed without using bedrails?: A Little Help needed moving to and from a bed to a chair (including a wheelchair)?: A Little Help needed standing up from a chair using your arms (e.g., wheelchair or bedside chair)?: A Little Help needed to walk in hospital room?: A Little Help needed climbing 3-5 steps with a railing? : Total 6 Click Score: 16    End of Session Equipment Utilized During Treatment: Gait belt Activity Tolerance: Patient tolerated treatment well Patient left: in chair;with call bell/phone within reach;with chair alarm set Nurse Communication: Mobility status PT Visit Diagnosis: Unsteadiness on feet (R26.81);Other abnormalities of gait and  mobility (R26.89);Muscle weakness (generalized) (M62.81)     Time: 1751-0258 PT Time Calculation (min) (ACUTE ONLY): 27 min  Charges:  $Therapeutic Activity: 23-37 mins                     Lillia Pauls, PT, DPT Acute Rehabilitation Services Office [phone]    Norval Morton 06/04/2022, 11:30 AM

## 2022-06-05 ENCOUNTER — Inpatient Hospital Stay (HOSPITAL_COMMUNITY): Payer: Medicare PPO

## 2022-06-05 LAB — CBC
HCT: 23.2 % — ABNORMAL LOW (ref 36.0–46.0)
Hemoglobin: 7.6 g/dL — ABNORMAL LOW (ref 12.0–15.0)
MCH: 30.2 pg (ref 26.0–34.0)
MCHC: 32.8 g/dL (ref 30.0–36.0)
MCV: 92.1 fL (ref 80.0–100.0)
Platelets: 300 10*3/uL (ref 150–400)
RBC: 2.52 MIL/uL — ABNORMAL LOW (ref 3.87–5.11)
RDW: 15.9 % — ABNORMAL HIGH (ref 11.5–15.5)
WBC: 11.1 10*3/uL — ABNORMAL HIGH (ref 4.0–10.5)
nRBC: 0 % (ref 0.0–0.2)

## 2022-06-05 LAB — URINALYSIS, ROUTINE W REFLEX MICROSCOPIC
Bilirubin Urine: NEGATIVE
Glucose, UA: NEGATIVE mg/dL
Hgb urine dipstick: NEGATIVE
Ketones, ur: NEGATIVE mg/dL
Leukocytes,Ua: NEGATIVE
Nitrite: NEGATIVE
Protein, ur: NEGATIVE mg/dL
Specific Gravity, Urine: 1.016 (ref 1.005–1.030)
pH: 5 (ref 5.0–8.0)

## 2022-06-05 LAB — BASIC METABOLIC PANEL
Anion gap: 6 (ref 5–15)
BUN: 14 mg/dL (ref 8–23)
CO2: 26 mmol/L (ref 22–32)
Calcium: 8.5 mg/dL — ABNORMAL LOW (ref 8.9–10.3)
Chloride: 105 mmol/L (ref 98–111)
Creatinine, Ser: 1.11 mg/dL — ABNORMAL HIGH (ref 0.44–1.00)
GFR, Estimated: 47 mL/min — ABNORMAL LOW (ref >60)
Glucose, Bld: 119 mg/dL — ABNORMAL HIGH (ref 70–99)
Potassium: 3.1 mmol/L — ABNORMAL LOW (ref 3.5–5.1)
Sodium: 137 mmol/L (ref 135–145)

## 2022-06-05 MED ORDER — IOHEXOL 300 MG/ML  SOLN
100.0000 mL | Freq: Once | INTRAMUSCULAR | Status: AC | PRN
  Administered 2022-06-05: 100 mL via INTRAVENOUS

## 2022-06-05 MED ORDER — POTASSIUM CHLORIDE 20 MEQ PO PACK
40.0000 meq | PACK | Freq: Once | ORAL | Status: AC
Start: 2022-06-05 — End: 2022-06-05
  Administered 2022-06-05: 40 meq via ORAL
  Filled 2022-06-05: qty 2

## 2022-06-05 MED ORDER — LEVOFLOXACIN 500 MG PO TABS: 500.0000 mg | ORAL_TABLET | Freq: Every day | ORAL | Status: DC

## 2022-06-05 MED ORDER — LEVOFLOXACIN 500 MG PO TABS
250.0000 mg | ORAL_TABLET | Freq: Every day | ORAL | Status: DC
  Administered 2022-06-05 – 2022-06-06 (×2): 250 mg via ORAL
  Filled 2022-06-05 (×2): qty 1

## 2022-06-05 NOTE — TOC Progression Note (Signed)
Transition of Care [facility]) - Progression Note    Patient Details  Name: Diane Robertson MRN: [id number] Date of Birth: 27-Dec-1928  Transition [facility] Care [facility]) CM/SW Contact  Carley Hammed, [place] Phone Number: 06/05/2022, 10:02 AM  Clinical Narrative:    CSW was notified by MD that pt is not medically ready at this time for SNF, and may not be ready to DC today. CSW updated facility. Patient's insurance authorization is good through 6/28. TOC will continue to follow for DC needs.   Expected Discharge Plan: Skilled Nursing Facility Barriers to Discharge: Continued Medical Work up  Expected Discharge Plan and Services Expected Discharge Plan: Skilled Nursing Facility In-house Referral: Clinical Social Work     Living arrangements for the past 2 months: Single Family Home                                       Social Determinants of Health (SDOH) Interventions    Readmission Risk Interventions     No data to display

## 2022-06-05 NOTE — Progress Notes (Signed)
Occupational Therapy Treatment Patient Details Name: SKYLEE BAIRD MRN: [id number] DOB: Nov 01, 1929 Today's Date: 06/05/2022   History of present illness Pt is a 88yo F admitted on 6/4 with c/o abdominal pain. Found to have bilateral PEs and small bowel ostruction. S/p small bowel resection on 6/16. Pt with 2 recent admission for diverticulitis. PMH: Dementia, HTN, diverticulitis.   OT comments  Patient received in bed and agreeable to OT session. Patient had urinary incontinence ambulating to bathroom and performed bathing and dressing seated on toilet. Patient able to perform toilet hygiene seated and required mod assist with clothing management. Patient performed grooming standing at sink for oral hygiene and face and hand hygiene and asked to sit due to back pain. Patient making good gains and will continue to be followed by acute OT.    Recommendations for follow up therapy are one component of a multi-disciplinary discharge planning process, led by the attending physician.  Recommendations may be updated based on patient status, additional functional criteria and insurance authorization.    Follow Up Recommendations  Skilled nursing-short term rehab (<3 hours/day)    Assistance Recommended at Discharge Frequent or constant Supervision/Assistance  Patient can return home with the following  A little help with walking and/or transfers;Help with stairs or ramp for entrance;Assist for transportation;Assistance with cooking/housework;Direct supervision/assist for financial management;Direct supervision/assist for medications management;A little help with bathing/dressing/bathroom   Equipment Recommendations  None recommended by OT    Recommendations for Other Services      Precautions / Restrictions Precautions Precautions: Fall;Other (comment) Precaution Comments: watch BP, abdominal incision, stress incontinence Restrictions Weight Bearing Restrictions: No       Mobility Bed  Mobility Overal bed mobility: Needs Assistance Bed Mobility: Supine to Sit     Supine to sit: Min assist     General bed mobility comments: cues for initiating and increased time    Transfers Overall transfer level: Needs assistance Equipment used: Rolling walker (2 wheels), None Transfers: Sit to/from Stand, Bed to chair/wheelchair/BSC Sit to Stand: Min assist     Step pivot transfers: Min assist     General transfer comment: min assist to power up and safety with mobiltiy     Balance Overall balance assessment: Needs assistance Sitting-balance support: Feet supported Sitting balance-Leahy Scale: Good     Standing balance support: Bilateral upper extremity supported, Single extremity supported, During functional activity Standing balance-Leahy Scale: Poor Standing balance comment: stood at sink for grooming with one extremity support                           ADL either performed or assessed with clinical judgement   ADL Overall ADL's : Needs assistance/impaired     Grooming: Wash/dry hands;Wash/dry face;Oral care;Minimal assistance;Standing Grooming Details (indicate cue type and reason): stood at sink Upper Body Bathing: Supervision/ safety;Sitting Upper Body Bathing Details (indicate cue type and reason): on toilet Lower Body Bathing: Moderate assistance;Sit to/from stand Lower Body Bathing Details (indicate cue type and reason): for lower legs seated on toilet Upper Body Dressing : Minimal assistance;Sitting Upper Body Dressing Details (indicate cue type and reason): changed gowns Lower Body Dressing: Maximal assistance;Sitting/lateral leans Lower Body Dressing Details (indicate cue type and reason): changed socks Toilet Transfer: Minimal assistance;Regular Toilet;Rolling walker (2 wheels)   Toileting- Clothing Manipulation and Hygiene: Moderate assistance;Sit to/from stand Toileting - Clothing Manipulation Details (indicate cue type and reason):  performed toilet hygiene seated on toilet required mod assist for clothing  management       General ADL Comments: Patient had urinary incontenance ambulating to bathroom and performed bathing and gown change on toilet    Extremity/Trunk Assessment              Vision       Perception     Praxis      Cognition Arousal/Alertness: Awake/alert Behavior During Therapy: WFL for tasks assessed/performed Overall Cognitive Status: History of cognitive impairments - at baseline                       Memory: Decreased short-term memory, Decreased recall of precautions Following Commands: Follows one step commands inconsistently, Follows one step commands with increased time Safety/Judgement: Decreased awareness of safety     General Comments: unable to give date or place. Frequent cues to stay on task        Exercises      Shoulder Instructions       General Comments      Pertinent Vitals/ Pain       Pain Assessment Pain Assessment: Faces Pain Location: back Pain Descriptors / Indicators: Aching, Sore, Grimacing, Guarding Pain Intervention(s): Monitored during session, Repositioned, Patient requesting pain meds-RN notified  Home Living                                          Prior Functioning/Environment              Frequency  Min 2X/week        Progress Toward Goals  OT Goals(current goals can now be found in the care plan section)  Progress towards OT goals: Progressing toward goals  Acute Rehab OT Goals OT Goal Formulation: With patient Time For Goal Achievement: 06/08/22 Potential to Achieve Goals: Good ADL Goals Pt Will Perform Grooming: with set-up;standing Pt Will Perform Upper Body Dressing: with set-up;sitting Pt Will Perform Lower Body Dressing: with min guard assist;sit to/from stand Pt Will Transfer to Toilet: with supervision;ambulating;regular height toilet Pt/caregiver will Perform Home Exercise  Program: Increased strength;Both right and left upper extremity;With Supervision  Plan Discharge plan remains appropriate    Co-evaluation                 AM-PAC OT "6 Clicks" Daily Activity     Outcome Measure   Help from another person eating meals?: None Help from another person taking care of personal grooming?: A Little Help from another person toileting, which includes using toliet, bedpan, or urinal?: A Little Help from another person bathing (including washing, rinsing, drying)?: A Lot Help from another person to put on and taking off regular upper body clothing?: A Little Help from another person to put on and taking off regular lower body clothing?: A Lot 6 Click Score: 17    End of Session Equipment Utilized During Treatment: Rolling walker (2 wheels)  OT Visit Diagnosis: Unsteadiness on feet (R26.81);Muscle weakness (generalized) (M62.81);Other symptoms and signs involving cognitive function;Pain   Activity Tolerance Patient tolerated treatment well   Patient Left in chair;with call bell/phone within reach;with chair alarm set   Nurse Communication Mobility status;Patient requests pain meds        Time: 1240-1309 OT Time Calculation (min): 29 min  Charges: OT General Charges $OT Visit: 1 Visit OT Treatments $Self Care/Home Management : 23-37 mins  Alfonse Flavors, OTA Acute Rehabilitation Services  Office [phone]  Riot Barrick Jeannett Senior 06/05/2022, 1:52 PM

## 2022-06-05 NOTE — Progress Notes (Addendum)
Patient ID: Diane Robertson, female   DOB: [birth year], 88 y.o.   MRN: [id number] [facility] Surgery Progress Note  11 Days Post-Op  Subjective: CC-   No new complaints. Denies abdominal pain. Unsure if she has had flatus or BM in last 24 hours. Denies nausea or vomiting. Full breakfast tray at bedside - patient states she did not want to chew all that food.   Objective: Vital signs in last 24 hours: Temp:  [98 F (36.7 C)-100.6 F (38.1 C)] 99 F (37.2 C) (06/27 0736) Pulse Rate:  [62-72] 62 (06/27 0736) Resp:  [15-17] 16 (06/27 0736) BP: (94-144)/(41-52) 94/41 (06/27 0736) SpO2:  [93 %-97 %] 95 % (06/27 0736) Last BM Date : 05/31/22  Intake/Output from previous day: 06/26 0701 - 06/27 0700 In: 360 [P.O.:360] Out: 550 [Urine:550] Intake/Output this shift: No intake/output data recorded.  PE: Gen:  Alert, NAD, pleasant GI: soft, mild distention, appropriately tender, no cellulitis, staples c/d/i  Lab Results:  Recent Labs    06/04/22 0513 06/05/22 0440  WBC 6.8 11.1*  HGB 8.0* 7.6*  HCT 24.7* 23.2*  PLT 284 300   BMET Recent Labs    06/04/22 0513 06/05/22 0440  NA 139 137  K 3.8 3.1*  CL 107 105  CO2 23 26  GLUCOSE 99 119*  BUN 11 14  CREATININE 0.95 1.11*  CALCIUM 8.4* 8.5*   PT/INR No results for input(s): "LABPROT", "INR" in the last 72 hours. CMP     Component Value Date/Time   NA 137 06/05/2022 0440   K 3.1 (L) 06/05/2022 0440   CL 105 06/05/2022 0440   CO2 26 06/05/2022 0440   GLUCOSE 119 (H) 06/05/2022 0440   BUN 14 06/05/2022 0440   CREATININE 1.11 (H) 06/05/2022 0440   CALCIUM 8.5 (L) 06/05/2022 0440   PROT 4.0 (L) 05/31/2022 0313   ALBUMIN 1.7 (L) 05/31/2022 0313   AST 25 05/31/2022 0313   ALT 19 05/31/2022 0313   ALKPHOS 67 05/31/2022 0313   BILITOT 0.8 05/31/2022 0313   GFRNONAA 47 (L) 06/05/2022 0440   Lipase  No results found for: "LIPASE"  Studies/Results: DG CHEST PORT 1 VIEW  Result Date: 06/05/2022 CLINICAL DATA:   PNA EXAM: PORTABLE CHEST 1 VIEW COMPARISON:  06/22/2022 FINDINGS: Infiltrate at the lung bases. No edema, effusion, or pneumothorax. Normal heart size and mediastinal contours. Artifact from EKG leads. IMPRESSION: Bilateral lower lobe pneumonia. Electronically Signed   By: Tiburcio Pea M.D.   On: 06/05/2022 09:58    Anti-infectives: Anti-infectives (From admission, onward)    Start     Dose/Rate Route Frequency Ordered Stop   06/05/22 1115  levofloxacin (LEVAQUIN) tablet 250 mg        250 mg Oral Daily 06/05/22 1016     06/05/22 1045  levofloxacin (LEVAQUIN) tablet 500 mg  Status:  Discontinued        500 mg Oral Daily 06/05/22 0953 06/05/22 1016   05/25/22 2200  piperacillin-tazobactam (ZOSYN) IVPB 3.375 g        3.375 g 12.5 mL/hr over 240 Minutes Intravenous Every 8 hours 05/25/22 1910 05/26/22 1008   05/24/22 0200  piperacillin-tazobactam (ZOSYN) IVPB 3.375 g  Status:  Discontinued        3.375 g 12.5 mL/hr over 240 Minutes Intravenous Every 8 hours 05/24/22 0112 05/25/22 1910        Assessment/Plan POD#11 s/p exploratory laparotomy, 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 Andrey Campanile - path w/out malignancy  -  afebrile, VSS, WBC increased to 11 today. - anastomotic ozzing on hep gtt. appropriate rise in hemoglobin after 1 unit PRBCs 6/21. A couple more melanotic stools 6/22. Hgb relatively stable on heparin gtt, but had another melanotic stool and hep gtt held. Hgb remained stabled at DOAC resumed 6/26 - increased PPI to BID on 6/25 - Soft diet and protein supplements. - continue PT/OT, mobilize - recommending SNF - I will make the patients follow up appointment. Plan for D/C staples POD#14. - new leukocytosis. Patient complained of LLQ pain yesterday and may have a slight increase in abdominal distention today; accurate history is difficult to gather due to the patients dementia. Recommend CT scan of the abdomen and pelvis to r/o intra-abdominal abscess.  Noted CXR this AM with lower lobe infiltrates. It is possible leukocytosis is attributable to PNA alone but I do think it is worth ruling out abscess after intestinal perforation. Will also be able to evaluate for any possible intra-abdominal hematoma. CT chest abdomen and pelvis pending.   ID - zosyn 6/15>>6/17 FEN - SOFT, Boost; give Kcl and Mg for goal of Mg > 2.0 and K > 4.0 VTE - SCDs, hep gtt  Acute subsegmental PE right lower lobe lung, Bilateral DVTs lower extremity HTN Dementia   LOS: 12 days    Adam Phenix, MD [facility] Surgery 06/05/2022, 10:43 AM Please see Amion for pager number during day hours 7:00am-4:30pm

## 2022-06-05 NOTE — Progress Notes (Signed)
PROGRESS NOTE   Diane Robertson  ESP:[id number] DOB: 26-Nov-1929 DOA: 05/23/2022 PCP: Wynona Luna, MD  Brief Narrative:   88 year old white female Known underlying HTN, diverticulitis probable dementia Admit 05/23/2022 abdominal pain hospitalized twice in [facility] for this recently-developed worsening abdominal pain loose stool also lower leg tenderness Work-up in [facility] ED = CTA abdomen pelvis = acute segmental subsegmental PE right lower lobe of lung Inflammatory mass punctate focus of extraluminal gas distal small bowel  6/15: General surgery consulted-recommend ex lap with SB resection, palliative care consulted as above 6/16: exploratory laparotomy with small bowel resection 6/17: Postop ileus with NG tube in place 6/21: Melanotic stool, NG tube removed sips trial 6/27: White count/fever elevated and working up for pneumonia  Hospital-Problem based course  Leukocytosis fever overnight CXR personally reviewed with no specific gross signs of pneumonia We will get urine analysis in addition today--- start Levaquin 500 daily after getting UA Discussed with general surgery who plans to scan abdomen so we will scan chest as well Inflammatory mass distal small bowel?  Diverticulitis--- pathology without malignancy ex lap/ bowel resection 6/16 Complication of postoperative ileus--NG tube DC 6/21 Pain control tramadol 50 every 8 prn-- DC morphine 6/23 Zosyn DC previously-saline lock fluids 6/24 Unexpected anemia of acute blood loss Melena probably from post anastomotic bleed S/p 05/30/2022 1 unit PRBC PPI p.o. twice daily and H2 blocker changed to p.o.  Melanotic stools last noted 6/25 unformed in appearance hemoglobin has been stable for the past several days and has not dropped Eliquis 5 twice daily started on 6/25 diet graduated 6/24 soft diet Because of unclarity of whether she is having dark stools versus not-plan to scan abdomen to ensure no bleeding Acute subsegmental  PE right lower lobe lung Bilateral DVTs lower extremity Will use apixaban 5 twice daily and monitor for bleeding HTN Continue atenolol 25 daily Losartan has been discontinued--previously used hydralazine has been held Probable underlying dementia Reorient as able-does have intermittent which is around baseline  DVT prophylaxis: SCD at this time Code Status: Full Family Communication: Called patient's niece [name] [phone] on 6/25 and updated fully Disposition:  Status is: Inpatient Remains inpatient appropriate because:   Likely skilled facility on 6/27 if hemoglobin remains stable   Consultants:  General surgery  Procedures:   Antimicrobials:     Subjective:  Appears to be doing fair this morning but cannot really tell me if she had a dark stool if she is having burning urine or any other issue She does not remember Nursing reports no dark stool-I do see a fever of 100.6 overnight so I think it is imperative to work-up prior to discharge   Objective: Vitals:   06/04/22 1951 06/04/22 2344 06/05/22 0427 06/05/22 0736  BP: (!) 134/52 (!) 124/51 (!) 103/47 (!) 94/41  Pulse: 65 72 66 62  Resp: 17 17 15 16   Temp: 98 F (36.7 C) (!) 100.6 F (38.1 C) 99.2 F (37.3 C) 99 F (37.2 C)  TempSrc: Oral Oral Oral Oral  SpO2: 97% 93% 94% 95%  Weight:      Height:        Intake/Output Summary (Last 24 hours) at 06/05/2022 0943 Last data filed at 06/04/2022 2015 Gross per 24 hour  Intake 240 ml  Output 350 ml  Net -110 ml    Filed Weights   05/23/22 1400  Weight: 58.1 kg    Examination:  Awake coherent x1 mild confusion S1-S2 no murmur no rub no gallop  ROM is intact moving 4 limbs equally Staples on front of abdomen-seems about the same in terms of distention No lower extremity edema ROM intact neurologically intact Psych is euthymic coherent   Data Reviewed: personally reviewed   CBC    Component Value Date/Time   WBC 11.1 (H) 06/05/2022 0440    RBC 2.52 (L) 06/05/2022 0440   HGB 7.6 (L) 06/05/2022 0440   HCT 23.2 (L) 06/05/2022 0440   PLT 300 06/05/2022 0440   MCV 92.1 06/05/2022 0440   MCH 30.2 06/05/2022 0440   MCHC 32.8 06/05/2022 0440   RDW 15.9 (H) 06/05/2022 0440   LYMPHSABS 1.3 05/31/2022 0313   MONOABS 0.7 05/31/2022 0313   EOSABS 0.3 05/31/2022 0313   BASOSABS 0.1 05/31/2022 0313      Latest Ref Rng & Units 06/05/2022    4:40 AM 06/04/2022    5:13 AM 06/02/2022    2:46 AM  CMP  Glucose 70 - 99 mg/dL 786  99  767   BUN 8 - 23 mg/dL 14  11  12    Creatinine 0.44 - 1.00 mg/dL  2.09  4.70   Sodium 135 - 145 mmol/L 137  139  139   Potassium 3.5 - 5.1 mmol/L 3.1  3.8  3.5   Chloride 98 - 111 mmol/L 105  107  109   CO2 22 - 32 mmol/L 26  23  23    Calcium 8.9 - 10.3 mg/dL 8.5  8.4  8.3      Radiology Studies: No results found.   Scheduled Meds:  apixaban  5 mg Oral BID   atenolol  25 mg Oral Q2200   Chlorhexidine Gluconate Cloth  6 each Topical Daily   famotidine  20 mg Oral Daily   feeding supplement  1 Container Oral TID BM   lactose free nutrition  237 mL Oral TID WC   multivitamin with minerals  1 tablet Oral Daily   pantoprazole  40 mg Oral BID   potassium chloride  40 mEq Oral Once   sodium chloride flush  3 mL Intravenous Q12H   Continuous Infusions:     LOS: 12 days   Time spent: 9  , MD [facility] To contact the attending provider between 7A-7P or the covering provider during after hours 7P-7A, please log into the web site [email or web address] and access using universal [place] password for that web site. If you do not have the password, please call the hospital operator.  06/05/2022, 9:43 AM

## 2022-06-06 DIAGNOSIS — E43 Unspecified severe protein-calorie malnutrition: Secondary | ICD-10-CM

## 2022-06-06 LAB — CBC
HCT: 22.3 % — ABNORMAL LOW (ref 36.0–46.0)
Hemoglobin: 7 g/dL — ABNORMAL LOW (ref 12.0–15.0)
MCH: 29.8 pg (ref 26.0–34.0)
MCHC: 31.4 g/dL (ref 30.0–36.0)
MCV: 94.9 fL (ref 80.0–100.0)
Platelets: 324 10*3/uL (ref 150–400)
RBC: 2.35 MIL/uL — ABNORMAL LOW (ref 3.87–5.11)
RDW: 16.1 % — ABNORMAL HIGH (ref 11.5–15.5)
WBC: 6.8 10*3/uL (ref 4.0–10.5)
nRBC: 0 % (ref 0.0–0.2)

## 2022-06-06 LAB — BASIC METABOLIC PANEL
Anion gap: 6 (ref 5–15)
BUN: 13 mg/dL (ref 8–23)
CO2: 25 mmol/L (ref 22–32)
Calcium: 8.5 mg/dL — ABNORMAL LOW (ref 8.9–10.3)
Chloride: 107 mmol/L (ref 98–111)
Creatinine, Ser: 1.04 mg/dL — ABNORMAL HIGH (ref 0.44–1.00)
GFR, Estimated: 51 mL/min — ABNORMAL LOW (ref >60)
Glucose, Bld: 92 mg/dL (ref 70–99)
Potassium: 3.4 mmol/L — ABNORMAL LOW (ref 3.5–5.1)
Sodium: 138 mmol/L (ref 135–145)

## 2022-06-06 LAB — TYPE AND SCREEN
ABO/RH(D): A POS
Antibody Screen: NEGATIVE

## 2022-06-06 LAB — URINE CULTURE

## 2022-06-06 LAB — HEMOGLOBIN AND HEMATOCRIT, BLOOD
HCT: 23.6 % — ABNORMAL LOW (ref 36.0–46.0)
Hemoglobin: 7.7 g/dL — ABNORMAL LOW (ref 12.0–15.0)

## 2022-06-06 MED ORDER — LEVOFLOXACIN 250 MG PO TABS: 250.0000 mg | ORAL_TABLET | Freq: Every day | ORAL | 0 refills | Status: DC

## 2022-06-06 MED ORDER — APIXABAN 5 MG PO TABS
5.0000 mg | ORAL_TABLET | Freq: Two times a day (BID) | ORAL | Status: AC
Start: 2022-06-06 — End: ?

## 2022-06-06 MED ORDER — TRAMADOL HCL 50 MG PO TABS
50.0000 mg | ORAL_TABLET | Freq: Three times a day (TID) | ORAL | 0 refills | Status: DC | PRN
Start: 2022-06-06 — End: 2023-06-02

## 2022-06-06 MED ORDER — TRAMADOL HCL 50 MG PO TABS: 50.0000 mg | ORAL_TABLET | Freq: Three times a day (TID) | ORAL | 0 refills | Status: DC | PRN

## 2022-06-06 MED ORDER — PANTOPRAZOLE SODIUM 40 MG PO TBEC: 40.0000 mg | DELAYED_RELEASE_TABLET | Freq: Two times a day (BID) | ORAL | Status: AC

## 2022-06-06 MED ORDER — POTASSIUM CHLORIDE CRYS ER 10 MEQ PO TBCR
40.0000 meq | EXTENDED_RELEASE_TABLET | Freq: Once | ORAL | Status: AC
Start: 2022-06-06 — End: 2022-06-06
  Administered 2022-06-06: 40 meq via ORAL
  Filled 2022-06-06: qty 4

## 2022-06-06 MED ORDER — BOOST PLUS PO LIQD: 237.0000 mL | Freq: Three times a day (TID) | ORAL | 0 refills | Status: AC

## 2022-06-06 MED ORDER — ADULT MULTIVITAMIN W/MINERALS CH: 1.0000 | ORAL_TABLET | Freq: Every day | ORAL | Status: AC

## 2022-06-26 ENCOUNTER — Emergency Department (HOSPITAL_BASED_OUTPATIENT_CLINIC_OR_DEPARTMENT_OTHER): Payer: Medicare PPO

## 2022-06-26 ENCOUNTER — Emergency Department (HOSPITAL_BASED_OUTPATIENT_CLINIC_OR_DEPARTMENT_OTHER)
Admission: EM | Admit: 2022-06-26 | Discharge: 2022-06-26 | Disposition: A | Discharge status: Home / Self Care | Payer: Medicare PPO | Attending: Emergency Medicine | Admitting: Emergency Medicine

## 2022-06-26 ENCOUNTER — Other Ambulatory Visit: Payer: Self-pay

## 2022-06-26 ENCOUNTER — Encounter (HOSPITAL_BASED_OUTPATIENT_CLINIC_OR_DEPARTMENT_OTHER): Payer: Self-pay

## 2022-06-26 DIAGNOSIS — Z7901 Long term (current) use of anticoagulants: Secondary | ICD-10-CM | POA: Diagnosis not present

## 2022-06-26 DIAGNOSIS — S0990XA Unspecified injury of head, initial encounter: Secondary | ICD-10-CM | POA: Diagnosis present

## 2022-06-26 DIAGNOSIS — F0390 Unspecified dementia without behavioral disturbance: Secondary | ICD-10-CM | POA: Insufficient documentation

## 2022-06-26 DIAGNOSIS — S0181XA Laceration without foreign body of other part of head, initial encounter: Secondary | ICD-10-CM | POA: Insufficient documentation

## 2022-06-26 DIAGNOSIS — W01198A Fall on same level from slipping, tripping and stumbling with subsequent striking against other object, initial encounter: Secondary | ICD-10-CM | POA: Insufficient documentation

## 2022-06-26 DIAGNOSIS — W19XXXA Unspecified fall, initial encounter: Secondary | ICD-10-CM

## 2022-06-26 DIAGNOSIS — Z86711 Personal history of pulmonary embolism: Secondary | ICD-10-CM | POA: Diagnosis not present

## 2022-06-26 NOTE — ED Notes (Signed)
EKG done at 1037, not 1137.

## 2022-06-26 NOTE — ED Provider Notes (Signed)
Emergency Department Provider Note   I have reviewed the triage vital signs and the nursing notes.   HISTORY  Chief Complaint Fall   HPI Diane Robertson is a 88 y.o. female presents to the emergency department for evaluation of head injury after fall.  Patient has a history of some mild dementia and falls.  She was bending over to feed her cats today when she lost balance and fell forward striking her forehead on the ground.  She denies loss of consciousness.  She presents here with her niece who describes her at her mental status baseline.  Patient is not complaining of pain in the chest or abdomen.  Denies any pain in the extremities.  Forehead laceration noted by family.  Per chart review patient had recent history of PE and is currently on Eliquis.   History reviewed. No pertinent past medical history.  Review of Systems  Constitutional: No fever/chills Eyes: No visual changes. ENT: No sore throat. Cardiovascular: Denies chest pain. Respiratory: Denies shortness of breath. Gastrointestinal: No abdominal pain.  No nausea, no vomiting.  No diarrhea.  No constipation. Genitourinary: Negative for dysuria. Musculoskeletal: Negative for back pain. Skin: Positive forehead laceration.  Neurological: Negative for focal weakness or numbness. Positive HA.    ____________________________________________   PHYSICAL EXAM:  VITAL SIGNS: ED Triage Vitals  Enc Vitals Group     BP 06/26/22 1036 135/72     Pulse Rate 06/26/22 1036 68     Resp 06/26/22 1036 14     Temp 06/26/22 1036 (!) 97.4 F (36.3 C)     Temp Source 06/26/22 1036 Oral     SpO2 06/26/22 1036 100 %     Weight 06/26/22 1039 110 lb (49.9 kg)     Height 06/26/22 1039 5\' 4"  (1.626 m)   Constitutional: Alert and oriented. Well appearing and in no acute distress. Eyes: Conjunctivae are normal.  Head: Forehead hematoma with 3 cm horizontal laceration with minimal bleeding. Nose: No  congestion/rhinnorhea. Mouth/Throat: Mucous membranes are moist.   Neck: No stridor.   Cardiovascular: Normal rate, regular rhythm. Good peripheral circulation. Grossly normal heart sounds.   Respiratory: Normal respiratory effort.  No retractions. Lungs CTAB. Gastrointestinal: Soft and nontender. No distention.  Musculoskeletal: No lower extremity tenderness nor edema. No gross deformities of extremities. Normal ROM of the bilateral upper and lower extremities.  Neurologic:  Normal speech and language. No gross focal neurologic deficits are appreciated.  Skin:  Skin is warm, dry and intact. No rash noted.  ____________________________________________  EKG   EKG Interpretation  Date/Time:  Tuesday June 26 2022 10:37:57 EDT Ventricular Rate:  67 PR Interval:  144 QRS Duration: 86 QT Interval:  401 QTC Calculation: 424 R Axis:   47 Text Interpretation: Sinus rhythm Nonspecific repol abnormality, diffuse leads No old tracing to compare Confirmed by [id number] [phone]) on 06/26/2022 10:46:17 AM        ____________________________________________  RADIOLOGY  CT Head Wo Contrast  Result Date: 06/26/2022 CLINICAL DATA:  Fall head and neck trauma EXAM: CT HEAD WITHOUT CONTRAST CT CERVICAL SPINE WITHOUT CONTRAST TECHNIQUE: Multidetector CT imaging of the head and cervical spine was performed following the standard protocol without intravenous contrast. Multiplanar CT image reconstructions of the cervical spine were also generated. RADIATION DOSE REDUCTION: This exam was performed according to the departmental dose-optimization program which includes automated exposure control, adjustment of the mA and/or kV according to patient size and/or use of iterative reconstruction technique. COMPARISON:  None Available.  FINDINGS: CT HEAD FINDINGS Brain: No evidence of acute infarction, hemorrhage, cerebral edema, mass, mass effect, or midline shift. No hydrocephalus or extra-axial fluid collection.  Cerebral volume is within normal limits for age. Vascular: No hyperdense vessel. Skull: Normal. Negative for fracture or focal lesion. Frontal forehead laceration. Sinuses/Orbits: No acute finding. Status post bilateral lens replacements. Other: The mastoid air cells are well aerated. CT CERVICAL SPINE FINDINGS Alignment: Normal. Skull base and vertebrae: No acute fracture or suspicious osseous lesion. Soft tissues and spinal canal: No prevertebral fluid or swelling. No visible canal hematoma. Disc levels: Multilevel degenerative changes without high-grade spinal canal stenosis. Uncovertebral and facet arthropathy, which results in moderate neural foraminal narrowing C5-C6 on the right. Upper chest: No focal pulmonary opacity or pleural effusion. Other: None IMPRESSION: 1.  No acute intracranial process. 2.  No acute fracture or traumatic listhesis in the cervical spine. Electronically Signed   By: Wiliam Ke M.D.   On: 06/26/2022 11:24   CT Cervical Spine Wo Contrast  Result Date: 06/26/2022 CLINICAL DATA:  Fall head and neck trauma EXAM: CT HEAD WITHOUT CONTRAST CT CERVICAL SPINE WITHOUT CONTRAST TECHNIQUE: Multidetector CT imaging of the head and cervical spine was performed following the standard protocol without intravenous contrast. Multiplanar CT image reconstructions of the cervical spine were also generated. RADIATION DOSE REDUCTION: This exam was performed according to the departmental dose-optimization program which includes automated exposure control, adjustment of the mA and/or kV according to patient size and/or use of iterative reconstruction technique. COMPARISON:  None Available. FINDINGS: CT HEAD FINDINGS Brain: No evidence of acute infarction, hemorrhage, cerebral edema, mass, mass effect, or midline shift. No hydrocephalus or extra-axial fluid collection. Cerebral volume is within normal limits for age. Vascular: No hyperdense vessel. Skull: Normal. Negative for fracture or focal lesion.  Frontal forehead laceration. Sinuses/Orbits: No acute finding. Status post bilateral lens replacements. Other: The mastoid air cells are well aerated. CT CERVICAL SPINE FINDINGS Alignment: Normal. Skull base and vertebrae: No acute fracture or suspicious osseous lesion. Soft tissues and spinal canal: No prevertebral fluid or swelling. No visible canal hematoma. Disc levels: Multilevel degenerative changes without high-grade spinal canal stenosis. Uncovertebral and facet arthropathy, which results in moderate neural foraminal narrowing C5-C6 on the right. Upper chest: No focal pulmonary opacity or pleural effusion. Other: None IMPRESSION: 1.  No acute intracranial process. 2.  No acute fracture or traumatic listhesis in the cervical spine. Electronically Signed   By: Wiliam Ke M.D.   On: 06/26/2022 11:24   DG Pelvis Portable  Result Date: 06/26/2022 CLINICAL DATA:  Fall today. EXAM: PORTABLE PELVIS 1-2 VIEWS COMPARISON:  None Available. FINDINGS: Status post right total hip arthroplasty. The hardware appears intact, without loosening. The bones are adequately mineralized. No evidence of acute fracture or dislocation. Mild degenerative changes in the lower lumbar spine, sacroiliac joints and left hip. No focal soft tissue abnormalities are identified. IMPRESSION: No evidence of acute pelvic fracture or dislocation. Electronically Signed   By: Carey Bullocks M.D.   On: 06/26/2022 11:22   DG Chest Portable 1 View  Result Date: 06/26/2022 CLINICAL DATA:  Fall today. EXAM: PORTABLE CHEST 1 VIEW COMPARISON:  None Available. FINDINGS: 1113 hours. The heart size and mediastinal contours are normal. The lungs are clear. There is no pleural effusion or pneumothorax. No acute osseous findings are identified. The patient's arms overlie the upper abdomen. Telemetry leads overlie the chest. IMPRESSION: No evidence of acute chest injury or active cardiopulmonary process. Electronically Signed  By: Carey Bullocks M.D.    On: 06/26/2022 11:21    ____________________________________________   PROCEDURES  Procedure(s) performed:   Marland KitchenMarland KitchenLaceration Repair  Date/Time: 06/26/2022 11:50 AM  Performed by: Maia Plan, MD Authorized by: Maia Plan, MD   Consent:    Consent obtained:  Verbal   Consent given by:  Patient and guardian (niece)   Risks, benefits, and alternatives were discussed: yes     Risks discussed:  Infection, need for additional repair, poor cosmetic result, poor wound healing, retained foreign body and pain   Alternatives discussed:  No treatment Universal protocol:    Patient identity confirmed:  Verbally with patient Anesthesia:    Anesthesia method:  None Laceration details:    Location:  Face   Face location:  Forehead   Length (cm):  4 Pre-procedure details:    Preparation:  Patient was prepped and draped in usual sterile fashion and imaging obtained to evaluate for foreign bodies Exploration:    Limited defect created (wound extended): no     Hemostasis achieved with:  Direct pressure   Imaging obtained comment:  CT head   Wound extent: no fascia violation noted, no muscle damage noted, no nerve damage noted, no underlying fracture noted and no vascular damage noted     Contaminated: no   Treatment:    Area cleansed with:  Shur-Clens   Amount of cleaning:  Standard   Visualized foreign bodies/material removed: no   Skin repair:    Repair method:  Tissue adhesive Approximation:    Approximation:  Close Repair type:    Repair type:  Simple Post-procedure details:    Dressing:  Open (no dressing)   Procedure completion:  Tolerated well, no immediate complications    ____________________________________________   INITIAL IMPRESSION / ASSESSMENT AND PLAN / ED COURSE  Pertinent labs & imaging results that were available during my care of the patient were reviewed by me and considered in my medical decision making (see chart for details).   This patient is  Presenting for Evaluation of head injury/fall, which does require a range of treatment options, and is a complaint that involves a high risk of morbidity and mortality.  The Differential Diagnoses includes subdural hematoma, epidural hematoma, acute concussion, traumatic subarachnoid hemorrhage, cerebral contusions, etc.   I did obtain Additional Historical Information from niece at bedside.  I decided to review pertinent External Data, and in summary patient does have a chart marked for merge which I reviewed showing recent admit during which patient was diagnosed with PE and started on Eliquis.   Clinical Laboratory Tests: Considered lab imaging but patient's vital signs are within normal limits and history provided seems to suggest more mechanical fall etiology. Defer emergent labs for now.  Radiologic Tests Ordered, included CT head and c spine and plain films of the chest and pelvis. I independently interpreted the images and agree with radiology interpretation.   Cardiac Monitor Tracing which shows NSR.   Social Determinants of Health Risk patient with a former smoking history.   Medical Decision Making: Summary:  Patient presents emergency department for evaluation after fall.  The mechanism appears mechanical.  Patient is anticoagulated with evidence of head injury on exam.  Plan for CT imaging of the head along with cervical spine.  Will obtain screening x-rays of the chest and pelvis but much lower suspicion for injury in these locations.  Patient will require laceration repair to the forehead, likely with Dermabond.  Reevaluation with update and  discussion with patient and niece at bedside.  CT imaging is reassuring.  After risk/benefit discussion of laceration repair, decided for Dermabond after cleaning.  Patient tolerated this well.  We will follow with the primary care physician in the coming week.  Disposition: discharge  ____________________________________________  FINAL  CLINICAL IMPRESSION(S) / ED DIAGNOSES  Final diagnoses:  Injury of head, initial encounter  Laceration of forehead, initial encounter  Fall, initial encounter    Note:  This document was prepared using Dragon voice recognition software and may include unintentional dictation errors.  Alona Bene, MD, [facility]    Kamren Heskett, Arlyss Repress, MD 06/26/22 1153

## 2022-06-26 NOTE — ED Notes (Signed)
Pt is curently living with family

## 2022-06-26 NOTE — ED Notes (Signed)
Family member states that Pt is at base line cognitively. Pt denis pain n/v or blurry vision

## 2022-06-26 NOTE — ED Triage Notes (Signed)
Pt's daughter states she bent over to feed the cat and fell. Fell face forward, states did not lose consciousness, states she remembers the whole event. States she does not have headache, and head does not hurt.A/O x4.

## 2022-06-26 NOTE — Discharge Instructions (Signed)
You were seen in the emergency room today after a fall with head injury.  We were able to repair your forehead laceration with glue which will come off over the next week.  If you notice redness or swelling near the laceration that could be sign of infection and you should return for follow-up.

## 2022-07-11 ENCOUNTER — Ambulatory Visit: Payer: Self-pay | Admitting: Registered Nurse

## 2022-07-12 ENCOUNTER — Other Ambulatory Visit: Payer: Self-pay

## 2022-07-12 ENCOUNTER — Emergency Department (HOSPITAL_BASED_OUTPATIENT_CLINIC_OR_DEPARTMENT_OTHER): Payer: Medicare PPO | Admitting: Radiology

## 2022-07-12 ENCOUNTER — Encounter (HOSPITAL_COMMUNITY): Payer: Self-pay

## 2022-07-12 ENCOUNTER — Emergency Department (HOSPITAL_BASED_OUTPATIENT_CLINIC_OR_DEPARTMENT_OTHER)
Admission: EM | Admit: 2022-07-12 | Discharge: 2022-07-12 | Disposition: A | Discharge status: Home / Self Care | Payer: Medicare PPO | Attending: Emergency Medicine | Admitting: Emergency Medicine

## 2022-07-12 ENCOUNTER — Emergency Department (HOSPITAL_COMMUNITY)
Admission: EM | Admit: 2022-07-12 | Discharge: 2022-07-12 | Disposition: A | Discharge status: Home / Self Care | Payer: Medicare PPO | Attending: Emergency Medicine | Admitting: Emergency Medicine

## 2022-07-12 ENCOUNTER — Encounter (HOSPITAL_BASED_OUTPATIENT_CLINIC_OR_DEPARTMENT_OTHER): Payer: Self-pay | Admitting: Emergency Medicine

## 2022-07-12 DIAGNOSIS — I219 Acute myocardial infarction, unspecified: Secondary | ICD-10-CM | POA: Diagnosis not present

## 2022-07-12 DIAGNOSIS — F0390 Unspecified dementia without behavioral disturbance: Secondary | ICD-10-CM | POA: Insufficient documentation

## 2022-07-12 DIAGNOSIS — Z7901 Long term (current) use of anticoagulants: Secondary | ICD-10-CM | POA: Diagnosis not present

## 2022-07-12 DIAGNOSIS — Z79899 Other long term (current) drug therapy: Secondary | ICD-10-CM | POA: Insufficient documentation

## 2022-07-12 DIAGNOSIS — Z5321 Procedure and treatment not carried out due to patient leaving prior to being seen by health care provider: Secondary | ICD-10-CM | POA: Diagnosis not present

## 2022-07-12 DIAGNOSIS — F03C18 Unspecified dementia, severe, with other behavioral disturbance: Secondary | ICD-10-CM | POA: Insufficient documentation

## 2022-07-12 DIAGNOSIS — M549 Dorsalgia, unspecified: Secondary | ICD-10-CM | POA: Diagnosis not present

## 2022-07-12 NOTE — ED Notes (Addendum)
Provider at bedside

## 2022-07-12 NOTE — ED Notes (Signed)
[facility] in [place] [phone]

## 2022-07-12 NOTE — ED Provider Notes (Signed)
[facility]-EMERGENCY DEPT Provider Note   CSN: [id number] Arrival date & time: 07/12/22  1231     History  No chief complaint on file.   Diane Robertson is a 88 y.o. female.  HPI Patient has medical history positive for PE and DVT on Eliquis.  Patient has dementia and is with her elderly brother.  They are being cared for by the brother's daughter who is main caregiver and POA but out of town.  Patient's other niece is currently here with her for evaluation.  She reports the patient has been extremely active in the home and difficult to redirect.  Dementia has been worsening.  She reports she is repeatedly saying there is something wrong for which she needs to be taken to the hospital.  Patient has been seen several times and had a recent fall with evaluation 2 weeks earlier.  Patient's niece reports they are working on memory care placement.  She reports that the patient has had decreased appetite but increased activity levels.  She reports that she will not leave the house and try to walk to the neighbors house.  This is what precipitated today's visit.  The patient walked over to the neighbor's house and told them that she had chest pain and needed to go to the hospital.  As I speak with the patient, she reports she does not have any chest pain and does not believe that she walked over to the neighbor's house to tell them that she did.  He denies any areas of pain.  There is no report of any more recent fall besides the one with prior visit.    Home Medications Prior to Admission medications   Medication Sig Start Date End Date Taking? Authorizing Provider  apixaban (ELIQUIS) 5 MG TABS tablet Take 1 tablet (5 mg total) by mouth 2 (two) times daily. 06/06/22   Joseph Art, DO  atenolol (TENORMIN) 25 MG tablet Take 25 mg by mouth daily.    [provider]  cetirizine (ZYRTEC) 10 MG tablet Take 10 mg by mouth daily as needed for allergies.    [provider]  co-enzyme Q-10 30 MG capsule Take 30 mg by mouth daily.    [provider]  famotidine (PEPCID) 20 MG tablet Take 20 mg by mouth 2 (two) times daily.    [provider]  ipratropium (ATROVENT) 0.03 % nasal spray Place 2 sprays into both nostrils every 12 (twelve) hours as needed for rhinitis.    [provider]  lactose free nutrition (BOOST PLUS) LIQD Take 237 mLs by mouth 3 (three) times daily with meals. 06/06/22   Joseph Art, DO  levofloxacin (LEVAQUIN) 250 MG tablet Take 1 tablet (250 mg total) by mouth daily. 06/07/22   Joseph Art, DO  Multiple Vitamin (MULTIVITAMIN WITH MINERALS) TABS tablet Take 1 tablet by mouth daily. 06/07/22   Joseph Art, DO  pantoprazole (PROTONIX) 40 MG tablet Take 1 tablet (40 mg total) by mouth 2 (two) times daily. 06/06/22   Joseph Art, DO  saccharomyces boulardii (FLORASTOR) 250 MG capsule Take 250 mg by mouth 2 (two) times daily.    [provider]  traMADol (ULTRAM) 50 MG tablet Take 1 tablet (50 mg total) by mouth every 8 (eight) hours as needed for severe pain. 06/06/22   Joseph Art, DO      Allergies    Bee venom    Review of Systems  Review of Systems Patient denies positives on review of systems. Physical Exam Updated Vital Signs BP 116/64 (BP Location: Right Arm)   Pulse 76   Temp 97.7 F (36.5 C) (Oral)   Resp 16   Ht 5\' 4"  (1.626 m)   Wt 49.8 kg   SpO2 100%   BMI 18.85 kg/m  Physical Exam Constitutional:      Comments: Patient is thin but well in appearance.  She is very alert and talkative.  HENT:     Head:     Comments: No acute contusions or abrasions.    Nose: Nose normal.     Mouth/Throat:     Pharynx: Oropharynx is clear.  Eyes:     Extraocular Movements: Extraocular movements intact.  Cardiovascular:     Rate and Rhythm: Normal rate and regular rhythm.  Pulmonary:     Effort: Pulmonary effort is normal.     Breath sounds: Normal breath sounds.   Chest:     Chest wall: No tenderness.  Abdominal:     General: There is no distension.     Palpations: Abdomen is soft.     Tenderness: There is no abdominal tenderness. There is no guarding.  Musculoskeletal:        General: No swelling, tenderness or deformity. Normal range of motion.     Comments: Patient can use and move all 4 extremities without difficulty.  No areas of deformity or apparent pain with range of motion.  Skin:    General: Skin is warm and dry.  Neurological:     Comments: Patient is alert and verbally interactive.  She is situationally aware but conversation clearly illustrates lack of recall and confabulation.  Movements are coordinated purposeful symmetric.  Patient can get out of the stretcher and ambulate.  She is coordinating her activities  Psychiatric:     Comments: Mood variable from elevated to quarrelsome     ED Results / Procedures / Treatments   Labs (all labs ordered are listed, but only abnormal results are displayed) Labs Reviewed - No data to display  EKG None  Radiology No results found.  Procedures Procedures    Medications Ordered in ED Medications - No data to display  ED Course/ Medical Decision Making/ A&P                           Medical Decision Making Patient presents as outlined.  I had extensive discussion with the patient's family member.  Patient has had escalating behavioral disturbances with dementia.  Patient is in the emergency department because she left the house and walked to the neighbors house citing chest pain and the need to come to the hospital.  Patient's caregiver reports they have had multiple medical evaluations and patient's general condition has been good.  Physically she is very active but is becoming extremely difficult to manage at home.  She is currently living with her brother.  The primary POA is the patient's niece.  The report is that due to the patient's escalated behaviors in the house and activity  level it is getting difficult and stressful for the patient's brother as well.  Clinically the patient is very well in appearance.  At this time she has no recollection of ever reporting chest pain to anyone.  On physical exam she is cooperative and no evidence of acute injuries.  She is eating and ambulating without difficulty.  Vital signs are stable.  I have  very little suspicion for acute medical illness or decompensation.  I have reviewed EMR and patient does not have significant acute cardiac risk factors beyond her age.  She is anticoagulated on Eliquis due to complications of DVT and PE post GI surgery.  Extensive physical exam does not show evidence of likely acute injury that would suggest need for CT head.  Patient's mental status is briskly alert.  She does not seem to be in pain or distress beyond wanting to be active and doing things in the emergency department.  At this time I do not feel that further diagnostic imaging or labs is indicated.  Patient's caregivers are proactively working with PCP for healthcare monitoring and placement for memory care.  At this time stable for discharge with precautionary return instructions reviewed with the patient's caregiver.         Final Clinical Impression(s) / ED Diagnoses Final diagnoses:  Severe dementia with other behavioral disturbance, unspecified dementia type [facility])    Rx / DC Orders ED Discharge Orders     None         Arby Barrette, MD 07/20/22 [phone]

## 2022-07-12 NOTE — Discharge Instructions (Signed)
1.  Please try to work with your primary care doctor to establish ongoing management of your medical problems.

## 2022-07-12 NOTE — ED Notes (Signed)
Attempted to call daughter, [employer] and [place], [phone] and [phone] respectively for an update.

## 2022-07-12 NOTE — ED Triage Notes (Signed)
Patient BIBA from neighbors house. Patient stated she thought she had a heart attack. EMS checked her out and reassured her. Patient has a hx of dementia. Patient daughter was called and stated patient has been doing this for a while for attention. POA is out of town.

## 2022-07-16 ENCOUNTER — Encounter (HOSPITAL_COMMUNITY): Payer: Self-pay

## 2023-03-12 ENCOUNTER — Ambulatory Visit: Payer: Medicare PPO | Admitting: Podiatry

## 2023-03-13 ENCOUNTER — Ambulatory Visit (INDEPENDENT_AMBULATORY_CARE_PROVIDER_SITE_OTHER): Payer: Medicare PPO | Admitting: Podiatry

## 2023-03-13 DIAGNOSIS — B351 Tinea unguium: Secondary | ICD-10-CM

## 2023-03-13 DIAGNOSIS — M79675 Pain in left toe(s): Secondary | ICD-10-CM | POA: Diagnosis not present

## 2023-03-13 DIAGNOSIS — M79674 Pain in right toe(s): Secondary | ICD-10-CM

## 2023-03-13 NOTE — Progress Notes (Signed)
   Chief Complaint  Patient presents with   Nail Problem    Routine foot care nail trim,     SUBJECTIVE Patient presents to office today complaining of elongated, thickened nails that cause pain while ambulating in shoes.  Patient is unable to trim their own nails. Patient is here for further evaluation and treatment.  Past Medical History:  Diagnosis Date   Diverticulitis    Hypertension     Allergies  Allergen Reactions   Amoxicillin Rash and Other (See Comments)   Codeine Other (See Comments) and Rash    headaches   Bee Venom Other (See Comments)    UNK reaction     OBJECTIVE General Patient is awake, alert, and oriented x 3 and in no acute distress. Derm Skin is dry and supple bilateral. Negative open lesions or macerations. Remaining integument unremarkable. Nails are tender, long, thickened and dystrophic with subungual debris, consistent with onychomycosis, 1-5 bilateral. No signs of infection noted. Vasc  DP and PT pedal pulses palpable bilaterally. Temperature gradient within normal limits.  Neuro Epicritic and protective threshold sensation grossly intact bilaterally.  Musculoskeletal Exam No symptomatic pedal deformities noted bilateral. Muscular strength within normal limits.  ASSESSMENT 1.  Pain due to onychomycosis of toenails both  PLAN OF CARE 1. Patient evaluated today.  2. Instructed to maintain good pedal hygiene and foot care.  3. Mechanical debridement of nails 1-5 bilaterally performed using a nail nipper. Filed with dremel without incident.  4. Return to clinic in 3 mos.    Edrick Kins, DPM [facility]  Dr. Edrick Kins, DPM    [address], [place] [address]                Office [phone]  Fax [phone]

## 2023-04-26 ENCOUNTER — Emergency Department (HOSPITAL_COMMUNITY): Payer: Medicare PPO

## 2023-04-26 ENCOUNTER — Emergency Department (HOSPITAL_COMMUNITY)
Admission: EM | Admit: 2023-04-26 | Discharge: 2023-04-26 | Disposition: A | Discharge status: Home / Self Care | Payer: Medicare PPO | Attending: Emergency Medicine | Admitting: Emergency Medicine

## 2023-04-26 DIAGNOSIS — Z79899 Other long term (current) drug therapy: Secondary | ICD-10-CM | POA: Diagnosis not present

## 2023-04-26 DIAGNOSIS — W19XXXA Unspecified fall, initial encounter: Secondary | ICD-10-CM | POA: Insufficient documentation

## 2023-04-26 DIAGNOSIS — I10 Essential (primary) hypertension: Secondary | ICD-10-CM | POA: Diagnosis not present

## 2023-04-26 DIAGNOSIS — S0083XA Contusion of other part of head, initial encounter: Secondary | ICD-10-CM | POA: Diagnosis present

## 2023-04-26 DIAGNOSIS — F0390 Unspecified dementia without behavioral disturbance: Secondary | ICD-10-CM | POA: Insufficient documentation

## 2023-04-26 DIAGNOSIS — Z7901 Long term (current) use of anticoagulants: Secondary | ICD-10-CM | POA: Insufficient documentation

## 2023-04-26 LAB — CBC WITH DIFFERENTIAL/PLATELET
Abs Immature Granulocytes: 0.01 10*3/uL (ref 0.00–0.07)
Basophils Absolute: 0.1 10*3/uL (ref 0.0–0.1)
Basophils Relative: 1 %
Eosinophils Absolute: 0.1 10*3/uL (ref 0.0–0.5)
Eosinophils Relative: 3 %
HCT: 37.3 % (ref 36.0–46.0)
Hemoglobin: 12.3 g/dL (ref 12.0–15.0)
Immature Granulocytes: 0 %
Lymphocytes Relative: 24 %
Lymphs Abs: 1.1 10*3/uL (ref 0.7–4.0)
MCH: 31.1 pg (ref 26.0–34.0)
MCHC: 33 g/dL (ref 30.0–36.0)
MCV: 94.2 fL (ref 80.0–100.0)
Monocytes Absolute: 0.5 10*3/uL (ref 0.1–1.0)
Monocytes Relative: 11 %
Neutro Abs: 2.8 10*3/uL (ref 1.7–7.7)
Neutrophils Relative %: 61 %
Platelets: 203 10*3/uL (ref 150–400)
RBC: 3.96 MIL/uL (ref 3.87–5.11)
RDW: 12.8 % (ref 11.5–15.5)
WBC: 4.6 10*3/uL (ref 4.0–10.5)
nRBC: 0 % (ref 0.0–0.2)

## 2023-04-26 LAB — COMPREHENSIVE METABOLIC PANEL
ALT: 17 U/L (ref 0–44)
AST: 20 U/L (ref 15–41)
Albumin: 3.2 g/dL — ABNORMAL LOW (ref 3.5–5.0)
Alkaline Phosphatase: 40 U/L (ref 38–126)
Anion gap: 8 (ref 5–15)
BUN: 22 mg/dL (ref 8–23)
CO2: 24 mmol/L (ref 22–32)
Calcium: 9.4 mg/dL (ref 8.9–10.3)
Chloride: 105 mmol/L (ref 98–111)
Creatinine, Ser: 1.38 mg/dL — ABNORMAL HIGH (ref 0.44–1.00)
GFR, Estimated: 36 mL/min — ABNORMAL LOW (ref >60)
Glucose, Bld: 94 mg/dL (ref 70–99)
Potassium: 3.8 mmol/L (ref 3.5–5.1)
Sodium: 137 mmol/L (ref 135–145)
Total Bilirubin: 0.8 mg/dL (ref 0.3–1.2)
Total Protein: 5.5 g/dL — ABNORMAL LOW (ref 6.5–8.1)

## 2023-04-26 LAB — URINALYSIS, ROUTINE W REFLEX MICROSCOPIC
Bilirubin Urine: NEGATIVE
Glucose, UA: NEGATIVE mg/dL
Hgb urine dipstick: NEGATIVE
Ketones, ur: NEGATIVE mg/dL
Leukocytes,Ua: NEGATIVE
Nitrite: NEGATIVE
Protein, ur: NEGATIVE mg/dL
Specific Gravity, Urine: 1.011 (ref 1.005–1.030)
pH: 7 (ref 5.0–8.0)

## 2023-04-26 NOTE — ED Provider Notes (Signed)
[place] [facility]MENT AT [facility] Provider Note   CSN: [id number] Arrival date & time: 04/26/23  0800     History  Chief Complaint  Patient presents with   Fall    On Thinners    Diane KLOUDA is a 89 y.o. female.  Pt is a 89 yo female with pmhx significant for diverticulitis, htn, gerd, PE/DVT and dementia.  Pt is a resident at [facility].  She had a hematoma to her forehead this am.  Staff did not see it last night.  It is unclear how she obtained it.  She was found in her bed this am.  She does not know what happened.  She is on Eliquis.       Home Medications Prior to Admission medications   Medication Sig Start Date End Date Taking? Authorizing Provider  apixaban (ELIQUIS) 5 MG TABS tablet Take 1 tablet (5 mg total) by mouth 2 (two) times daily. 06/06/22   Joseph Art, DO  atenolol (TENORMIN) 25 MG tablet Take 25 mg by mouth daily.    [provider]  cetirizine (ZYRTEC) 10 MG tablet Take 10 mg by mouth daily as needed for allergies.    [provider]  co-enzyme Q-10 30 MG capsule Take 30 mg by mouth daily.    [provider]  famotidine (PEPCID) 20 MG tablet Take 20 mg by mouth 2 (two) times daily.    [provider]  ipratropium (ATROVENT) 0.03 % nasal spray Place 2 sprays into both nostrils every 12 (twelve) hours as needed for rhinitis.    [provider]  lactose free nutrition (BOOST PLUS) LIQD Take 237 mLs by mouth 3 (three) times daily with meals. 06/06/22   Joseph Art, DO  levofloxacin (LEVAQUIN) 250 MG tablet Take 1 tablet (250 mg total) by mouth daily. 06/07/22   Joseph Art, DO  Multiple Vitamin (MULTIVITAMIN WITH MINERALS) TABS tablet Take 1 tablet by mouth daily. 06/07/22   Joseph Art, DO  pantoprazole (PROTONIX) 40 MG tablet Take 1 tablet (40 mg total) by mouth 2 (two) times daily. 06/06/22   Joseph Art, DO  saccharomyces boulardii (FLORASTOR) 250 MG capsule Take 250 mg  by mouth 2 (two) times daily.    [provider]  traMADol (ULTRAM) 50 MG tablet Take 1 tablet (50 mg total) by mouth every 8 (eight) hours as needed for severe pain. 06/06/22   Joseph Art, DO      Allergies    Amoxicillin, Codeine, and Bee venom    Review of Systems   Review of Systems  HENT:  Positive for facial swelling.   All other systems reviewed and are negative.   Physical Exam Updated Vital Signs BP 132/78   Pulse 61   Temp 98 F (36.7 C) (Oral)   Resp 13   Ht 5\' 4"  (1.626 m)   Wt 49 kg   SpO2 97%   BMI 18.54 kg/m  Physical Exam Vitals and nursing note reviewed.  Constitutional:      Appearance: Normal appearance.  HENT:     Head: Normocephalic.     Comments: Large hematoma left forehead    Right Ear: External ear normal.     Left Ear: External ear normal.     Nose: Nose normal.     Mouth/Throat:     Mouth: Mucous membranes are moist.     Pharynx: Oropharynx is clear.  Eyes:     Extraocular  Movements: Extraocular movements intact.     Conjunctiva/sclera: Conjunctivae normal.     Pupils: Pupils are equal, round, and reactive to light.  Cardiovascular:     Rate and Rhythm: Normal rate and regular rhythm.     Pulses: Normal pulses.     Heart sounds: Normal heart sounds.  Pulmonary:     Effort: Pulmonary effort is normal.     Breath sounds: Normal breath sounds.  Abdominal:     General: Abdomen is flat. Bowel sounds are normal.     Palpations: Abdomen is soft.  Musculoskeletal:     Cervical back: Normal range of motion and neck supple.  Skin:    Capillary Refill: Capillary refill takes less than 2 seconds.  Neurological:     General: No focal deficit present.     Mental Status: She is alert. Mental status is at baseline.  Psychiatric:        Mood and Affect: Mood normal.        Behavior: Behavior normal.     ED Results / Procedures / Treatments   Labs (all labs ordered are listed, but only abnormal results are displayed) Labs  Reviewed  COMPREHENSIVE METABOLIC PANEL - Abnormal; Notable for the following components:      Result Value   Creatinine, Ser 1.38 (*)    Total Protein 5.5 (*)    Albumin 3.2 (*)    GFR, Estimated 36 (*)    All other components within normal limits  CBC WITH DIFFERENTIAL/PLATELET  URINALYSIS, ROUTINE W REFLEX MICROSCOPIC    EKG EKG Interpretation  Date/Time:  Friday Apr 26 2023 08:22:49 EDT Ventricular Rate:  65 PR Interval:  156 QRS Duration: 92 QT Interval:  413 QTC Calculation: 430 R Axis:   35 Text Interpretation: Sinus rhythm Low voltage, precordial leads No significant change since last tracing Confirmed by Jacalyn Lefevre [phone] on 04/26/2023 8:38:24 AM  Radiology CT Head Wo Contrast  Result Date: 04/26/2023 CLINICAL DATA:  Head trauma, minor (Age >= 65y); Neck trauma (Age >= 65y) EXAM: CT HEAD WITHOUT CONTRAST CT CERVICAL SPINE WITHOUT CONTRAST TECHNIQUE: Multidetector CT imaging of the head and cervical spine was performed following the standard protocol without intravenous contrast. Multiplanar CT image reconstructions of the cervical spine were also generated. RADIATION DOSE REDUCTION: This exam was performed according to the departmental dose-optimization program which includes automated exposure control, adjustment of the mA and/or kV according to patient size and/or use of iterative reconstruction technique. COMPARISON:  None Available. FINDINGS: CT HEAD FINDINGS Brain: No evidence of acute infarction, hemorrhage, hydrocephalus, extra-axial collection or mass lesion/mass effect. Sequela of chronic microvascular ischemic change. Vascular: No hyperdense vessel or unexpected calcification. Skull: Soft tissue hematoma along the left frontal scalp. No evidence of underlying calvarial fracture. Sinuses/Orbits: No middle ear or mastoid effusion. Paranasal sinuses are clear. Orbits are notable for bilateral lens replacement. Otherwise unremarkable. Other: None. CT CERVICAL SPINE  FINDINGS Alignment: There is trace anterolisthesis of C4 on C5. Skull base and vertebrae: No acute fracture. No primary bone lesion or focal pathologic process. Compared to prior exam there is increased height loss of the superior endplate of T3, likely degenerative in secondary to Schmorl's node formation. Soft tissues and spinal canal: No evidence of high-grade spinal canal stenosis. Heterogeneous and multinodular right thyroid gland. Disc levels:  No evidence of high-grade spinal canal stenosis. Upper chest: Negative. Other: None IMPRESSION: 1. No acute intracranial abnormality. 2. Soft tissue hematoma along the left frontal scalp. No evidence of underlying calvarial  fracture. 3. No acute fracture or traumatic subluxation of the cervical spine. Electronically Signed   By: Lorenza Cambridge M.D.   On: 04/26/2023 09:03   CT Cervical Spine Wo Contrast  Result Date: 04/26/2023 CLINICAL DATA:  Head trauma, minor (Age >= 65y); Neck trauma (Age >= 65y) EXAM: CT HEAD WITHOUT CONTRAST CT CERVICAL SPINE WITHOUT CONTRAST TECHNIQUE: Multidetector CT imaging of the head and cervical spine was performed following the standard protocol without intravenous contrast. Multiplanar CT image reconstructions of the cervical spine were also generated. RADIATION DOSE REDUCTION: This exam was performed according to the departmental dose-optimization program which includes automated exposure control, adjustment of the mA and/or kV according to patient size and/or use of iterative reconstruction technique. COMPARISON:  None Available. FINDINGS: CT HEAD FINDINGS Brain: No evidence of acute infarction, hemorrhage, hydrocephalus, extra-axial collection or mass lesion/mass effect. Sequela of chronic microvascular ischemic change. Vascular: No hyperdense vessel or unexpected calcification. Skull: Soft tissue hematoma along the left frontal scalp. No evidence of underlying calvarial fracture. Sinuses/Orbits: No middle ear or mastoid effusion.  Paranasal sinuses are clear. Orbits are notable for bilateral lens replacement. Otherwise unremarkable. Other: None. CT CERVICAL SPINE FINDINGS Alignment: There is trace anterolisthesis of C4 on C5. Skull base and vertebrae: No acute fracture. No primary bone lesion or focal pathologic process. Compared to prior exam there is increased height loss of the superior endplate of T3, likely degenerative in secondary to Schmorl's node formation. Soft tissues and spinal canal: No evidence of high-grade spinal canal stenosis. Heterogeneous and multinodular right thyroid gland. Disc levels:  No evidence of high-grade spinal canal stenosis. Upper chest: Negative. Other: None IMPRESSION: 1. No acute intracranial abnormality. 2. Soft tissue hematoma along the left frontal scalp. No evidence of underlying calvarial fracture. 3. No acute fracture or traumatic subluxation of the cervical spine. Electronically Signed   By: Lorenza Cambridge M.D.   On: 04/26/2023 09:03   DG Pelvis Portable  Result Date: 04/26/2023 CLINICAL DATA:  Fall, anticoagulation EXAM: PORTABLE PELVIS 1-2 VIEWS COMPARISON:  06/26/2022 FINDINGS: Bony demineralization. Right total hip prosthesis noted. The distal margin of the stem of the femoral component is not included, but otherwise no complicating feature related to the implant is identified. Mild spurring of the left femoral head with overall morphology of the left proximal femur unchanged from prior exams. There is evidence of facet arthropathy at L4-5 and L5-S1. The arcuate lines of the sacrum appear intact. No acute bony findings observed. IMPRESSION: 1. No acute bony findings. 2. Right total hip prosthesis noted without complicating feature along the visualized portion. 3. Bony demineralization. 4. Lower lumbar facet arthropathy. 5. Mild spurring of the left femoral head. Electronically Signed   By: Gaylyn Rong M.D.   On: 04/26/2023 08:32   DG Chest Portable 1 View  Result Date:  04/26/2023 CLINICAL DATA:  Fall.  Anticoagulation. EXAM: PORTABLE CHEST 1 VIEW COMPARISON:  06/26/2022 FINDINGS: Mildly low lung volumes. The lungs appear clear. Cardiac and mediastinal margins appear normal. No blunting of the costophrenic angles. Mild thoracic spondylosis. No acute bony findings identified. IMPRESSION: 1. No active cardiopulmonary disease is radiographically apparent. 2. Mild thoracic spondylosis. Electronically Signed   By: Gaylyn Rong M.D.   On: 04/26/2023 08:30    Procedures Procedures    Medications Ordered in ED Medications - No data to display  ED Course/ Medical Decision Making/ A&P  Medical Decision Making Amount and/or Complexity of Data Reviewed Labs: ordered. Radiology: ordered.   This patient presents to the ED for concern of fall, this involves an extensive number of treatment options, and is a complaint that carries with it a high risk of complications and morbidity.  The differential diagnosis includes multiple trauma   Co morbidities that complicate the patient evaluation  diverticulitis, htn, gerd, PE/DVT and dementia   Additional history obtained:  Additional history obtained from epic chart review External records from outside source obtained and reviewed including EMS report   Lab Tests:  I Ordered, and personally interpreted labs.  The pertinent results include:  cbc nl, cmp nl othr than cr 1.38 and gfr low at 36 (GFR 51 in June)   Imaging Studies ordered:  I ordered imaging studies including ct head/ct c-spine/cxr/pelvis  I independently visualized and interpreted imaging which showed  CT head/c-spine: No acute intracranial abnormality.  2. Soft tissue hematoma along the left frontal scalp. No evidence of  underlying calvarial fracture.  3. No acute fracture or traumatic subluxation of the cervical spine.  Pelvis: . No acute bony findings.  2. Right total hip prosthesis noted without  complicating feature  along the visualized portion.  3. Bony demineralization.  4. Lower lumbar facet arthropathy.  5. Mild spurring of the left femoral head.  CXR: 1. No active cardiopulmonary disease is radiographically apparent.  2. Mild thoracic spondylosis.   I agree with the radiologist interpretation   Cardiac Monitoring:  The patient was maintained on a cardiac monitor.  I personally viewed and interpreted the cardiac monitored which showed an underlying rhythm of: nsr   Medicines ordered and prescription drug management:   I have reviewed the patients home medicines and have made adjustments as needed   Test Considered:  CT   Critical Interventions:  ct   Problem List / ED Course:  Fall:  forehead hematoma; no other injuries.  Pt is able to ambulate.  She is stable for d/c.  Pt's family member, Arts development officer notified.   Reevaluation:  After the interventions noted above, I reevaluated the patient and found that they have :improved   Social Determinants of Health:  Lives at ALF   Dispostion:  After consideration of the diagnostic results and the patients response to treatment, I feel that the patent would benefit from discharge with outpatient f/u.          Final Clinical Impression(s) / ED Diagnoses Final diagnoses:  Fall, initial encounter  Traumatic hematoma of forehead, initial encounter  On apixaban therapy    Rx / DC Orders ED Discharge Orders     None         Jacalyn Lefevre, MD 04/26/23 1121

## 2023-04-26 NOTE — ED Notes (Signed)
Ptar called pt will be next in line for pickup.

## 2023-04-26 NOTE — ED Notes (Signed)
Ambulated pt to RR and back to room pt did well pt stated she is ready to go home.

## 2023-04-26 NOTE — ED Triage Notes (Signed)
Pt BIB [facility] from [facility] due to unwitnessed fall.  Hx of dementia and HTN.  Pt does take Eliquis.  Hematoma on left forehead.  SNF reports she has been "off balance for a while"

## 2023-04-26 NOTE — Progress Notes (Signed)
Orthopedic Tech Progress Note Patient Details:  Diane Robertson [birth year] [id number] Level 2 Trauma. Not needed Patient ID: Diane Robertson, female   DOB: Nov 04, 1929, 89 y.o.   MRN: [id number]  Diane Robertson 04/26/2023, 8:20 AM

## 2023-06-01 ENCOUNTER — Observation Stay (HOSPITAL_COMMUNITY)
Admission: EM | Admit: 2023-06-01 | Discharge: 2023-06-02 | Disposition: A | Discharge status: Skilled Nursing Facility | Payer: Medicare PPO | Attending: Family Medicine | Admitting: Family Medicine

## 2023-06-01 ENCOUNTER — Emergency Department (HOSPITAL_COMMUNITY): Payer: Medicare PPO

## 2023-06-01 ENCOUNTER — Encounter (HOSPITAL_COMMUNITY): Payer: Self-pay

## 2023-06-01 DIAGNOSIS — Z79899 Other long term (current) drug therapy: Secondary | ICD-10-CM | POA: Insufficient documentation

## 2023-06-01 DIAGNOSIS — Z86718 Personal history of other venous thrombosis and embolism: Secondary | ICD-10-CM | POA: Insufficient documentation

## 2023-06-01 DIAGNOSIS — Z9181 History of falling: Secondary | ICD-10-CM | POA: Insufficient documentation

## 2023-06-01 DIAGNOSIS — R509 Fever, unspecified: Secondary | ICD-10-CM | POA: Insufficient documentation

## 2023-06-01 DIAGNOSIS — R2681 Unsteadiness on feet: Secondary | ICD-10-CM | POA: Insufficient documentation

## 2023-06-01 DIAGNOSIS — Y92009 Unspecified place in unspecified non-institutional (private) residence as the place of occurrence of the external cause: Secondary | ICD-10-CM | POA: Diagnosis not present

## 2023-06-01 DIAGNOSIS — I10 Essential (primary) hypertension: Secondary | ICD-10-CM | POA: Diagnosis present

## 2023-06-01 DIAGNOSIS — R059 Cough, unspecified: Secondary | ICD-10-CM | POA: Diagnosis present

## 2023-06-01 DIAGNOSIS — F0390 Unspecified dementia without behavioral disturbance: Secondary | ICD-10-CM | POA: Insufficient documentation

## 2023-06-01 DIAGNOSIS — Z7901 Long term (current) use of anticoagulants: Secondary | ICD-10-CM | POA: Diagnosis not present

## 2023-06-01 DIAGNOSIS — W19XXXA Unspecified fall, initial encounter: Secondary | ICD-10-CM | POA: Diagnosis not present

## 2023-06-01 DIAGNOSIS — J123 Human metapneumovirus pneumonia: Principal | ICD-10-CM | POA: Insufficient documentation

## 2023-06-01 DIAGNOSIS — M6281 Muscle weakness (generalized): Secondary | ICD-10-CM | POA: Insufficient documentation

## 2023-06-01 DIAGNOSIS — B349 Viral infection, unspecified: Secondary | ICD-10-CM | POA: Diagnosis present

## 2023-06-01 HISTORY — DX: Acute embolism and thrombosis of unspecified deep veins of unspecified lower extremity: I82.409

## 2023-06-01 HISTORY — DX: Other pulmonary embolism without acute cor pulmonale: I26.99

## 2023-06-01 LAB — COMPREHENSIVE METABOLIC PANEL
ALT: 18 U/L (ref 0–44)
AST: 20 U/L (ref 15–41)
Albumin: 3.2 g/dL — ABNORMAL LOW (ref 3.5–5.0)
Alkaline Phosphatase: 43 U/L (ref 38–126)
Anion gap: 9 (ref 5–15)
BUN: 23 mg/dL (ref 8–23)
CO2: 21 mmol/L — ABNORMAL LOW (ref 22–32)
Calcium: 9 mg/dL (ref 8.9–10.3)
Chloride: 106 mmol/L (ref 98–111)
Creatinine, Ser: 1.33 mg/dL — ABNORMAL HIGH (ref 0.44–1.00)
GFR, Estimated: 38 mL/min — ABNORMAL LOW (ref >60)
Glucose, Bld: 95 mg/dL (ref 70–99)
Potassium: 3.7 mmol/L (ref 3.5–5.1)
Sodium: 136 mmol/L (ref 135–145)
Total Bilirubin: 0.8 mg/dL (ref 0.3–1.2)
Total Protein: 5.6 g/dL — ABNORMAL LOW (ref 6.5–8.1)

## 2023-06-01 LAB — RESPIRATORY PANEL BY PCR

## 2023-06-01 LAB — URINALYSIS, W/ REFLEX TO CULTURE (INFECTION SUSPECTED)
Bacteria, UA: NONE SEEN
Bilirubin Urine: NEGATIVE
Glucose, UA: NEGATIVE mg/dL
Hgb urine dipstick: NEGATIVE
Ketones, ur: 20 mg/dL — AB
Leukocytes,Ua: NEGATIVE
Nitrite: NEGATIVE
Protein, ur: NEGATIVE mg/dL
Specific Gravity, Urine: 1.013 (ref 1.005–1.030)
pH: 6 (ref 5.0–8.0)

## 2023-06-01 LAB — TYPE AND SCREEN
ABO/RH(D): A POS
Antibody Screen: NEGATIVE

## 2023-06-01 LAB — CBC WITH DIFFERENTIAL/PLATELET
Abs Immature Granulocytes: 0.02 10*3/uL (ref 0.00–0.07)
Basophils Absolute: 0 10*3/uL (ref 0.0–0.1)
Basophils Relative: 0 %
Eosinophils Absolute: 0 10*3/uL (ref 0.0–0.5)
Eosinophils Relative: 0 %
HCT: 34.9 % — ABNORMAL LOW (ref 36.0–46.0)
Hemoglobin: 11.8 g/dL — ABNORMAL LOW (ref 12.0–15.0)
Immature Granulocytes: 0 %
Lymphocytes Relative: 14 %
Lymphs Abs: 0.8 10*3/uL (ref 0.7–4.0)
MCH: 32.6 pg (ref 26.0–34.0)
MCHC: 33.8 g/dL (ref 30.0–36.0)
MCV: 96.4 fL (ref 80.0–100.0)
Monocytes Absolute: 0.6 10*3/uL (ref 0.1–1.0)
Monocytes Relative: 9 %
Neutro Abs: 4.5 10*3/uL (ref 1.7–7.7)
Neutrophils Relative %: 77 %
Platelets: 149 10*3/uL — ABNORMAL LOW (ref 150–400)
RBC: 3.62 MIL/uL — ABNORMAL LOW (ref 3.87–5.11)
RDW: 13.2 % (ref 11.5–15.5)
WBC: 5.9 10*3/uL (ref 4.0–10.5)
nRBC: 0 % (ref 0.0–0.2)

## 2023-06-01 LAB — TROPONIN I (HIGH SENSITIVITY)
Troponin I (High Sensitivity): 7 ng/L (ref <18)
Troponin I (High Sensitivity): 8 ng/L (ref <18)

## 2023-06-01 LAB — LACTIC ACID, PLASMA
Lactic Acid, Venous: 1.6 mmol/L (ref 0.5–1.9)
Lactic Acid, Venous: 1.7 mmol/L (ref 0.5–1.9)

## 2023-06-01 MED ORDER — ENSURE ENLIVE PO LIQD
237.0000 mL | Freq: Two times a day (BID) | ORAL | Status: DC
  Administered 2023-06-02: 237 mL via ORAL

## 2023-06-01 MED ORDER — ATENOLOL 25 MG PO TABS
25.0000 mg | ORAL_TABLET | Freq: Every day | ORAL | Status: DC
  Administered 2023-06-02: 25 mg via ORAL
  Filled 2023-06-01: qty 1

## 2023-06-01 MED ORDER — SODIUM CHLORIDE 0.9 % IV BOLUS
500.0000 mL | Freq: Once | INTRAVENOUS | Status: AC
  Administered 2023-06-01: 500 mL via INTRAVENOUS

## 2023-06-01 MED ORDER — SACCHAROMYCES BOULARDII 250 MG PO CAPS
250.0000 mg | ORAL_CAPSULE | Freq: Two times a day (BID) | ORAL | Status: DC
  Administered 2023-06-01 – 2023-06-02 (×2): 250 mg via ORAL
  Filled 2023-06-01 (×2): qty 1

## 2023-06-01 MED ORDER — COENZYME Q10 30 MG PO CAPS: 30.0000 mg | ORAL_CAPSULE | Freq: Every day | ORAL | Status: DC

## 2023-06-01 MED ORDER — FAMOTIDINE 20 MG PO TABS
20.0000 mg | ORAL_TABLET | Freq: Two times a day (BID) | ORAL | Status: DC
  Administered 2023-06-01 – 2023-06-02 (×2): 20 mg via ORAL
  Filled 2023-06-01 (×2): qty 1

## 2023-06-01 MED ORDER — LORAZEPAM 0.5 MG PO TABS
0.5000 mg | ORAL_TABLET | Freq: Two times a day (BID) | ORAL | Status: DC
  Administered 2023-06-01 – 2023-06-02 (×2): 0.5 mg via ORAL
  Filled 2023-06-01 (×2): qty 1

## 2023-06-01 MED ORDER — PANTOPRAZOLE SODIUM 40 MG PO TBEC
40.0000 mg | DELAYED_RELEASE_TABLET | Freq: Every day | ORAL | Status: DC
  Administered 2023-06-02: 40 mg via ORAL
  Filled 2023-06-01: qty 1

## 2023-06-01 MED ORDER — BOOST PLUS PO LIQD
237.0000 mL | Freq: Three times a day (TID) | ORAL | Status: DC
  Administered 2023-06-02: 237 mL via ORAL
  Filled 2023-06-01 (×3): qty 237

## 2023-06-01 MED ORDER — ACETAMINOPHEN 500 MG PO TABS
1000.0000 mg | ORAL_TABLET | Freq: Once | ORAL | Status: AC
Start: 2023-06-01 — End: 2023-06-01
  Administered 2023-06-01: 1000 mg via ORAL
  Filled 2023-06-01: qty 2

## 2023-06-01 MED ORDER — SERTRALINE HCL 25 MG PO TABS
50.0000 mg | ORAL_TABLET | Freq: Every day | ORAL | Status: DC
  Administered 2023-06-02: 50 mg via ORAL
  Filled 2023-06-01: qty 2

## 2023-06-01 MED ORDER — ACETAMINOPHEN 325 MG PO TABS: 650.0000 mg | ORAL_TABLET | Freq: Four times a day (QID) | ORAL | Status: DC | PRN

## 2023-06-01 MED ORDER — FERROUS SULFATE 325 (65 FE) MG PO TABS
325.0000 mg | ORAL_TABLET | Freq: Every day | ORAL | Status: DC
  Administered 2023-06-02: 325 mg via ORAL
  Filled 2023-06-01: qty 1

## 2023-06-01 MED ORDER — APIXABAN 5 MG PO TABS
5.0000 mg | ORAL_TABLET | Freq: Two times a day (BID) | ORAL | Status: DC
  Administered 2023-06-01 – 2023-06-02 (×2): 5 mg via ORAL
  Filled 2023-06-01 (×2): qty 1

## 2023-06-01 MED ORDER — SODIUM CHLORIDE 0.9 % IV SOLN: Freq: Once | INTRAVENOUS | Status: AC

## 2023-06-01 MED ORDER — MIRTAZAPINE 15 MG PO TABS
7.5000 mg | ORAL_TABLET | Freq: Every day | ORAL | Status: DC
  Administered 2023-06-01: 7.5 mg via ORAL
  Filled 2023-06-01: qty 1

## 2023-06-01 NOTE — Progress Notes (Signed)
PHARMACIST - PHYSICIAN ORDER COMMUNICATION  CONCERNING: P&T Medication Policy on Herbal Medications  DESCRIPTION:  This patient's order for:  Co-enzyme Q 10  has been noted.  This product(s) is classified as an "herbal" or natural product. Due to a lack of definitive safety studies or FDA approval, nonstandard manufacturing practices, plus the potential risk of unknown drug-drug interactions while on inpatient medications, the Pharmacy and Therapeutics Committee does not permit the use of "herbal" or natural products of this type within [facility].   ACTION TAKEN: The pharmacy department is unable to verify this order at this time and your patient has been informed of this safety policy. Please reevaluate patient's clinical condition at discharge and address if the herbal or natural product(s) should be resumed at that time.  Rexford Maus, PharmD, BCPS 06/01/2023 8:03 PM

## 2023-06-01 NOTE — ED Notes (Signed)
Pt returned from CT °

## 2023-06-01 NOTE — ED Triage Notes (Addendum)
Pt bib ems from [facility]; unwitnessed fall; staff checked 0600, pt in bed; at 0700 found pt on ground between bed and dresser; no obvious injury or deformity; hx dementia, acting at baseline per staff, oriented to self only; cough present; recent UTI, finished abx; no reported UTI symptoms currently; c/o neck, back, and R leg pain; pt on Eliquis, hx DVT and PE; 99.71F, CBG 123, HTN 144/60, P 80, 94% RA

## 2023-06-01 NOTE — Assessment & Plan Note (Signed)
Patient presents for observation after she came in for unwitnessed fall, and was noted to have a fever to 101.4.  UA negative, chest x-ray negative, RPP positive for metapneumovirus.  This is likely source of fever.  Patient otherwise with stable vitals, and does not appear acutely ill. - Continue to monitor - Supportive care - Tylenol 650 mg q6h prn
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
## 2023-06-01 NOTE — ED Notes (Signed)
Patient transported to CT 

## 2023-06-01 NOTE — ED Provider Notes (Signed)
[place] EMERGENCY DEPARTMENT AT [facility] Provider Note   CSN: [id number] Arrival date & time: 06/01/23  0825     History  Chief Complaint  Patient presents with   Diane Robertson is a 89 y.o. female.  89 year old female with prior medical history as detailed below presents for evaluation.  Patient was checked on by staff around 6 AM.  Patient was in bed at that time.  Shortly thereafter at approximately 7 AM the patient was found on the floor near her bed between the bed and the dresser.  Patient cannot recall the events leading to her ending up on the floor this morning.  Patient without complaint of pain or obvious injury.  Patient with mild cough.  Patient with recent UTI and completed a 5-day course of cephalexin earlier this month.  Patient is on Eliquis.  Patient with history of DVT and PE.  Patient is warm to the touch and initial rectal temp is 101.4.  The history is provided by the patient and medical records.       Home Medications Prior to Admission medications   Medication Sig Start Date End Date Taking? Authorizing Provider  apixaban (ELIQUIS) 5 MG TABS tablet Take 1 tablet (5 mg total) by mouth 2 (two) times daily. 06/06/22   Joseph Art, DO  atenolol (TENORMIN) 25 MG tablet Take 25 mg by mouth daily.    [provider]  cetirizine (ZYRTEC) 10 MG tablet Take 10 mg by mouth daily as needed for allergies.    [provider]  co-enzyme Q-10 30 MG capsule Take 30 mg by mouth daily.    [provider]  famotidine (PEPCID) 20 MG tablet Take 20 mg by mouth 2 (two) times daily.    [provider]  ipratropium (ATROVENT) 0.03 % nasal spray Place 2 sprays into both nostrils every 12 (twelve) hours as needed for rhinitis.    [provider]  lactose free nutrition (BOOST PLUS) LIQD Take 237 mLs by mouth 3 (three) times daily with meals. 06/06/22   Joseph Art, DO  levofloxacin (LEVAQUIN) 250 MG  tablet Take 1 tablet (250 mg total) by mouth daily. 06/07/22   Joseph Art, DO  Multiple Vitamin (MULTIVITAMIN WITH MINERALS) TABS tablet Take 1 tablet by mouth daily. 06/07/22   Joseph Art, DO  pantoprazole (PROTONIX) 40 MG tablet Take 1 tablet (40 mg total) by mouth 2 (two) times daily. 06/06/22   Joseph Art, DO  saccharomyces boulardii (FLORASTOR) 250 MG capsule Take 250 mg by mouth 2 (two) times daily.    [provider]  traMADol (ULTRAM) 50 MG tablet Take 1 tablet (50 mg total) by mouth every 8 (eight) hours as needed for severe pain. 06/06/22   Joseph Art, DO      Allergies    Amoxicillin, Codeine, and Bee venom    Review of Systems   Review of Systems  All other systems reviewed and are negative.   Physical Exam Updated Vital Signs Temp (!) 101.4 F (38.6 C) (Rectal)  Physical Exam Vitals and nursing note reviewed.  Constitutional:      General: She is not in acute distress.    Appearance: Normal appearance. She is well-developed.  HENT:     Head: Normocephalic and atraumatic.  Eyes:     Conjunctiva/sclera: Conjunctivae normal.     Pupils: Pupils are equal, round, and reactive to light.  Cardiovascular:  Rate and Rhythm: Normal rate and regular rhythm.     Heart sounds: Normal heart sounds.  Pulmonary:     Effort: Pulmonary effort is normal. No respiratory distress.     Breath sounds: Normal breath sounds.  Abdominal:     General: There is no distension.     Palpations: Abdomen is soft.     Tenderness: There is no abdominal tenderness.  Musculoskeletal:        General: No deformity. Normal range of motion.     Cervical back: Normal range of motion and neck supple.  Skin:    General: Skin is warm and dry.  Neurological:     General: No focal deficit present.     Mental Status: She is alert and oriented to person, place, and time.     ED Results / Procedures / Treatments   Labs (all labs ordered are listed, but only abnormal results  are displayed) Labs Reviewed  URINALYSIS, W/ REFLEX TO CULTURE (INFECTION SUSPECTED) - Abnormal; Notable for the following components:      Result Value   Ketones, ur 20 (*)    All other components within normal limits  CBC WITH DIFFERENTIAL/PLATELET - Abnormal; Notable for the following components:   RBC 3.62 (*)    Hemoglobin 11.8 (*)    HCT 34.9 (*)    Platelets 149 (*)    All other components within normal limits  COMPREHENSIVE METABOLIC PANEL - Abnormal; Notable for the following components:   CO2 21 (*)    Creatinine, Ser 1.33 (*)    Total Protein 5.6 (*)    Albumin 3.2 (*)    GFR, Estimated 38 (*)    All other components within normal limits  URINE CULTURE  CULTURE, BLOOD (ROUTINE X 2)  CULTURE, BLOOD (ROUTINE X 2)  RESPIRATORY PANEL BY PCR  LACTIC ACID, PLASMA  LACTIC ACID, PLASMA  TYPE AND SCREEN  TROPONIN I (HIGH SENSITIVITY)  TROPONIN I (HIGH SENSITIVITY)    EKG None  Radiology No results found.  Procedures Procedures    Medications Ordered in ED Medications  acetaminophen (TYLENOL) tablet 1,000 mg (has no administration in time range)    ED Course/ Medical Decision Making/ A&P                             Medical Decision Making Amount and/or Complexity of Data Reviewed Labs: ordered. Radiology: ordered.  Risk OTC drugs. Prescription drug management. Decision regarding hospitalization.    Medical Screen Complete  This patient presented to the ED with complaint of fall/fever.  This complaint involves an extensive number of treatment options. The initial differential diagnosis includes, but is not limited to, trauma from fall, infection, metabolic abnormality, etc.  This presentation is: Acute, Chronic, Self-Limited, Previously Undiagnosed, Uncertain Prognosis, Complicated, Systemic Symptoms, and Threat to Life/Bodily Function  Patient found on floor this a.m. after fall that occurred sometime between 6 and 7 AM.  Patient without  recollection of the fall itself.  Patient without clear evidence of traumatic injury on exam.  Patient noted to be febrile on arrival with rectal temperature of 101.4.  Patient with mild cough but other wise no suggestion of bacterial infection.  Obtained workup including chest x-ray and urine is without clear indication of bacterial infection.  Imaging obtained is without evidence of significant acute traumatic injury.  Results of ED evaluation discussed with patient's POA Myrick, Gayle.  Ms. Daphane Shepherd requested patient remain in the  hospital for overnight observation.  DNR confirmed.  Family medicine made aware of case and will evaluate for admission.  Additional history obtained: External records from outside sources obtained and reviewed including prior ED visits and prior Inpatient records.    Lab Tests:  I ordered and personally interpreted labs.    Imaging Studies ordered:  I ordered imaging studies including chest x-ray I independently visualized and interpreted obtained imaging which showed NAD I agree with the radiologist interpretation.   Cardiac Monitoring:  The patient was maintained on a cardiac monitor.  I personally viewed and interpreted the cardiac monitor which showed an underlying rhythm of: NSR   Medicines ordered:  I ordered medication including Tylenol for fever Reevaluation of the patient after these medicines showed that the patient: improved    Problem List / ED Course:  Unwitnessed fall, fever   Reevaluation:  After the interventions noted above, I reevaluated the patient and found that they have: improved  Disposition:  After consideration of the diagnostic results and the patients response to treatment, I feel that the patent would benefit from admission for observation.          Final Clinical Impression(s) / ED Diagnoses Final diagnoses:  Fall, initial encounter  Fever, unspecified fever cause    Rx / DC Orders ED  Discharge Orders     None         Wynetta Fines, MD 06/01/23 [id number]

## 2023-06-01 NOTE — Assessment & Plan Note (Addendum)
Patient presents with history of severe dementia.  Patient lives in assisted living facility. -Delirium precautions -Assist w/ meals

## 2023-06-01 NOTE — Assessment & Plan Note (Signed)
Stable mental status this AM; pleasantly demented. Pupils PERRLA, head atraumatic. Thinks she is in a church and that her age is 89 years old. Oriented to self. - Need to have shared decision-making regarding Eliquis with history of frequent falls - PT/OT - Fall precautions

## 2023-06-01 NOTE — ED Notes (Signed)
ED TO INPATIENT HANDOFF REPORT  ED Nurse Name and Phone #:   S Name/Age/Gender Diane Robertson 89 y.o. female Room/Bed: 023C/023C  Code Status   Code Status: Full Code  Home/SNF/Other Nursing Home Patient oriented to: self Is this baseline? Yes   Triage Complete: Triage complete  Chief Complaint Viral illness [B34.9]  Triage Note [facility] bib [facility]; unwitnessed fall; staff checked 0600, pt in bed; at 0700 found pt on ground between bed and dresser; no obvious injury or deformity; hx dementia, acting at baseline per staff, oriented to self only; cough present; recent UTI, finished abx; no reported UTI symptoms currently; c/o neck, back, and R leg pain; pt on Eliquis, hx DVT and PE; 99.76F, CBG 123, HTN 144/60, P 80, 94% RA   Allergies Allergies  Allergen Reactions   Amoxicillin Rash and Other (See Comments)   Codeine Other (See Comments) and Rash    headaches   Bee Venom Other (See Comments)    UNK reaction    Level of Care/Admitting Diagnosis ED Disposition     ED Disposition  Admit   Condition  --   Comment  Hospital Area: [facility] [[id number]]  Level of Care: Med-Surg [16]  May place patient in observation at [facility] or [facility] if equivalent level of care is available:: No  Covid Evaluation: Asymptomatic - no recent exposure (last 10 days) testing not required  Diagnosis: Viral illness [161096]  Admitting Physician: Westley Chandler [[id number]]  Attending Physician: Westley Chandler [[id number]]          B Medical/Surgery History Past Medical History:  Diagnosis Date   Diverticulitis    DVT (deep venous thrombosis) (HCC)    Hypertension    Pulmonary embolism (HCC)    Past Surgical History:  Procedure Laterality Date   BOWEL RESECTION N/A 05/25/2022   Procedure: SMALL BOWEL RESECTION WITH DIVERTICULA;  Surgeon: Gaynelle Adu, MD;  Location: [facility] OR;  Service: General;  Laterality: N/A;   EYE SURGERY     Bilateral implants    LAPAROTOMY N/A 05/25/2022   Procedure: EXPLORATORY LAPAROTOMY;  Surgeon: Gaynelle Adu, MD;  Location: [facility] OR;  Service: General;  Laterality: N/A;   TONSILLECTOMY       A IV Location/Drains/Wounds Patient Lines/Drains/Airways Status     Active Line/Drains/Airways     Name Placement date Placement time Site Days   Peripheral IV 06/01/23 20 G Right Antecubital 06/01/23  0856  Antecubital  less than 1            Intake/Output Last 24 hours  Intake/Output Summary (Last 24 hours) at 06/01/2023 1920 Last data filed at 06/01/2023 1839 Gross per 24 hour  Intake 1000 ml  Output --  Net 1000 ml    Labs/Imaging Results for orders placed or performed during the hospital encounter of 06/01/23 (from the past 48 hour(s))  CBC with Differential     Status: Abnormal   Collection Time: 06/01/23  8:58 AM  Result Value Ref Range   WBC 5.9 4.0 - 10.5 K/uL   RBC 3.62 (L) 3.87 - 5.11 MIL/uL   Hemoglobin 11.8 (L) 12.0 - 15.0 g/dL   HCT 29.5 (L) 62.1 - 30.8 %   MCV 96.4 80.0 - 100.0 fL   MCH 32.6 26.0 - 34.0 pg   MCHC 33.8 30.0 - 36.0 g/dL   RDW 65.7 84.6 - 96.2 %   Platelets 149 (L) 150 - 400 K/uL   nRBC 0.0 0.0 -  0.2 %   Neutrophils Relative % 77 %   Neutro Abs 4.5 1.7 - 7.7 K/uL   Lymphocytes Relative 14 %   Lymphs Abs 0.8 0.7 - 4.0 K/uL   Monocytes Relative 9 %   Monocytes Absolute 0.6 0.1 - 1.0 K/uL   Eosinophils Relative 0 %   Eosinophils Absolute 0.0 0.0 - 0.5 K/uL   Basophils Relative 0 %   Basophils Absolute 0.0 0.0 - 0.1 K/uL   Immature Granulocytes 0 %   Abs Immature Granulocytes 0.02 0.00 - 0.07 K/uL    Comment: Performed at [facility], [address] [place], [place] [address]  Comprehensive metabolic panel     Status: Abnormal   Collection Time: 06/01/23  8:58 AM  Result Value Ref Range   Sodium 136 135 - 145 mmol/L   Potassium 3.7 3.5 - 5.1 mmol/L   Chloride 106 98 - 111 mmol/L   CO2 21 (L) 22 - 32 mmol/L   Glucose, Bld 95 70 - 99 mg/dL    Comment:  Glucose reference range applies only to samples taken after fasting for at least 8 hours.   BUN 23 8 - 23 mg/dL   Creatinine, Ser 0.86 (H) 0.44 - 1.00 mg/dL   Calcium 9.0 8.9 - 57.8 mg/dL   Total Protein 5.6 (L) 6.5 - 8.1 g/dL   Albumin 3.2 (L) 3.5 - 5.0 g/dL   AST 20 15 - 41 U/L   ALT 18 0 - 44 U/L   Alkaline Phosphatase 43 38 - 126 U/L   Total Bilirubin 0.8 0.3 - 1.2 mg/dL   GFR, Estimated 38 (L) >60 mL/min    Comment: (NOTE) Calculated using the CKD-EPI Creatinine Equation (2021)    Anion gap 9 5 - 15    Comment: Performed at [facility], [address], [place], [place] [address]  Lactic acid, plasma     Status: None   Collection Time: 06/01/23  8:58 AM  Result Value Ref Range   Lactic Acid, Venous 1.6 0.5 - 1.9 mmol/L    Comment: Performed at [facility], [address] [place], [place] [address]  Troponin I (High Sensitivity)     Status: None   Collection Time: 06/01/23  8:58 AM  Result Value Ref Range   Troponin I (High Sensitivity) 7 <18 ng/L    Comment: (NOTE) Elevated high sensitivity troponin I (hsTnI) values and significant  changes across serial measurements may suggest ACS but many other  chronic and acute conditions are known to elevate hsTnI results.  Refer to the "Links" section for chest pain algorithms and additional  guidance. Performed at [facility], [address], [place], [place] [address]   Type and screen [facility]     Status: None   Collection Time: 06/01/23  8:58 AM  Result Value Ref Range   ABO/RH(D) A POS    Antibody Screen NEG    Sample Expiration      06/04/2023,2359 Performed at [facility], [address], [place], [place] [address]   Lactic acid, plasma     Status: None   Collection Time: 06/01/23 12:54 PM  Result Value Ref Range   Lactic Acid, Venous 1.7 0.5 - 1.9 mmol/L    Comment: Performed at [facility], [address] [place], [place] [address]  Troponin I (High  Sensitivity)     Status: None   Collection Time: 06/01/23 12:54 PM  Result Value Ref Range   Troponin I (High Sensitivity) 8 <18 ng/L    Comment: (NOTE) Elevated high sensitivity troponin I (hsTnI) values and significant  changes across serial measurements may suggest ACS but many other  chronic and acute conditions are known to elevate hsTnI results.  Refer to the "Links" section for chest pain algorithms and additional  guidance. Performed at [facility], [address], [place], [place] [address]   Urinalysis, w/ Reflex to Culture (Infection Suspected) -Urine, Unspecified Source     Status: Abnormal   Collection Time: 06/01/23  2:05 PM  Result Value Ref Range   Specimen Source URINE, UNSPE    Color, Urine YELLOW YELLOW   APPearance CLEAR CLEAR   Specific Gravity, Urine 1.013 1.005 - 1.030   pH 6.0 5.0 - 8.0   Glucose, UA NEGATIVE NEGATIVE mg/dL   Hgb urine dipstick NEGATIVE NEGATIVE   Bilirubin Urine NEGATIVE NEGATIVE   Ketones, ur 20 (A) NEGATIVE mg/dL   Protein, ur NEGATIVE NEGATIVE mg/dL   Nitrite NEGATIVE NEGATIVE   Leukocytes,Ua NEGATIVE NEGATIVE   RBC / HPF 0-5 0 - 5 RBC/hpf   WBC, UA 0-5 0 - 5 WBC/hpf    Comment:        Reflex urine culture not performed if WBC <=10, OR if Squamous epithelial cells >5. If Squamous epithelial cells >5 suggest recollection.    Bacteria, UA NONE SEEN NONE SEEN   Squamous Epithelial / HPF 0-5 0 - 5 /HPF   Mucus PRESENT     Comment: Performed at [facility], [address] [place], [place] [address]  Respiratory (~20 pathogens) panel by PCR     Status: Abnormal   Collection Time: 06/01/23  2:36 PM   Specimen: Nasopharyngeal Swab; Respiratory  Result Value Ref Range   Adenovirus NOT DETECTED NOT DETECTED   Coronavirus 229E NOT DETECTED NOT DETECTED    Comment: (NOTE) The Coronavirus on the Respiratory Panel, DOES NOT test for the novel  Coronavirus (2019 nCoV)    Coronavirus HKU1 NOT DETECTED NOT DETECTED    Coronavirus NL63 NOT DETECTED NOT DETECTED   Coronavirus OC43 NOT DETECTED NOT DETECTED   Metapneumovirus DETECTED (A) NOT DETECTED   Rhinovirus / Enterovirus NOT DETECTED NOT DETECTED   Influenza A NOT DETECTED NOT DETECTED   Influenza B NOT DETECTED NOT DETECTED   Parainfluenza Virus 1 NOT DETECTED NOT DETECTED   Parainfluenza Virus 2 NOT DETECTED NOT DETECTED   Parainfluenza Virus 3 NOT DETECTED NOT DETECTED   Parainfluenza Virus 4 NOT DETECTED NOT DETECTED   Respiratory Syncytial Virus NOT DETECTED NOT DETECTED   Bordetella pertussis NOT DETECTED NOT DETECTED   Bordetella Parapertussis NOT DETECTED NOT DETECTED   Chlamydophila pneumoniae NOT DETECTED NOT DETECTED   Mycoplasma pneumoniae NOT DETECTED NOT DETECTED    Comment: Performed at [facility], [address], [place], [place] [address]   CT Head Wo Contrast  Result Date: 06/01/2023 CLINICAL DATA:  Head trauma, minor (Age >= 65y) EXAM: CT HEAD WITHOUT CONTRAST TECHNIQUE: Contiguous axial images were obtained from the base of the skull through the vertex without intravenous contrast. RADIATION DOSE REDUCTION: This exam was performed according to the departmental dose-optimization program which includes automated exposure control, adjustment of the mA and/or kV according to patient size and/or use of iterative reconstruction technique. COMPARISON:  04/26/2023 FINDINGS: Brain: No evidence of acute infarction, hemorrhage, hydrocephalus, extra-axial collection or mass lesion/mass effect. Scattered low-density changes within the periventricular and  subcortical white matter most compatible with chronic microvascular ischemic change. Mild diffuse cerebral volume loss. Vascular: Atherosclerotic calcifications involving the large vessels of the skull base. No unexpected hyperdense vessel. Skull: Normal. Negative for fracture or focal lesion. Sinuses/Orbits: No acute finding. Other: None. IMPRESSION: 1. No acute intracranial findings. 2.  Chronic microvascular ischemic change and cerebral volume loss. Electronically Signed   By: Duanne Guess D.O.   On: 06/01/2023 09:54   DG Pelvis Portable  Result Date: 06/01/2023 CLINICAL DATA:  Unwitnessed fall. EXAM: PORTABLE PELVIS 1-2 VIEWS COMPARISON:  Apr 26, 2023. FINDINGS: Status post right total hip arthroplasty. There is no evidence of pelvic fracture or diastasis. No pelvic bone lesions are seen. IMPRESSION: No acute abnormality seen. Electronically Signed   By: Lupita Raider M.D.   On: 06/01/2023 09:10   DG Chest Port 1 View  Result Date: 06/01/2023 CLINICAL DATA:  Fever, unwitnessed fall. EXAM: PORTABLE CHEST 1 VIEW COMPARISON:  Apr 26, 2023. FINDINGS: The heart size and mediastinal contours are within normal limits. Both lungs are clear. The visualized skeletal structures are unremarkable. IMPRESSION: No active disease. Electronically Signed   By: Lupita Raider M.D.   On: 06/01/2023 09:09    Pending Labs Unresulted Labs (From admission, onward)     Start     Ordered   06/02/23 0500  Basic metabolic panel  Tomorrow morning,   R        06/01/23 1906   06/02/23 0500  CBC  Tomorrow morning,   R        06/01/23 1906   06/01/23 0835  Urine Culture  Once,   URGENT       Question:  Indication  Answer:  Dysuria   06/01/23 0835   06/01/23 0835  Culture, blood (routine x 2)  BLOOD CULTURE X 2,   R      06/01/23 0835            Vitals/Pain Today's Vitals   06/01/23 1552 06/01/23 1600 06/01/23 1630 06/01/23 1700  BP:  (!) 145/69 (!) 146/64 (!) 149/57  Pulse:  82 75 75  Resp:    20  Temp: 99.2 F (37.3 C)     TempSrc:      SpO2:  94% 98% 96%  PainSc:        Isolation Precautions Droplet precaution  Medications Medications  atenolol (TENORMIN) tablet 25 mg (has no administration in time range)  famotidine (PEPCID) tablet 20 mg (has no administration in time range)  pantoprazole (PROTONIX) EC tablet 40 mg (has no administration in time range)  saccharomyces  boulardii (FLORASTOR) capsule 250 mg (has no administration in time range)  apixaban (ELIQUIS) tablet 5 mg (has no administration in time range)  co-enzyme Q-10 capsule 30 mg (has no administration in time range)  lactose free nutrition (BOOST PLUS) liquid 237 mL (has no administration in time range)  acetaminophen (TYLENOL) tablet 1,000 mg (1,000 mg Oral Given 06/01/23 0903)  sodium chloride 0.9 % bolus 500 mL (0 mLs Intravenous Stopped 06/01/23 1114)  0.9 %  sodium chloride infusion (0 mLs Intravenous Stopped 06/01/23 1839)    Mobility walks with device     Focused Assessments    R Recommendations: See Admitting Provider Note  Report given to:   Additional Notes:

## 2023-06-01 NOTE — H&P (Signed)
Hospital Admission History and Physical Service Pager: [phone]  Patient name: Diane Robertson Medical record number: [id number] Date of Birth: Dec 09, 1929 Age: 89 y.o. Gender: female  Primary Care Provider: Pcp, No Consultants: None Code Status: Full   Preferred Emergency Contact:  Contact Information     Name Relation Home Work Mobile   myrick,gayle Daughter   [phone]   Marshell Garfinkel Daughter   [phone]   mynick,gayle Niece [phone]  [phone]       Chief Complaint: Fever  Assessment and Plan: Diane Robertson is a 89 y.o. female presenting with fever. Differential for this patient's presentation of this includes UTI, but less likely as UA was negative, PNA but less likely as CXR was negative, sepsis, but less likely as vitals are stable, except for the fever, viral illness most likely, given fever and no other signs of infection.   Hospital Problem List      Hospital     * (Principal) Viral illness     Patient presents for observation after she came in for unwitnessed  fall, and was noted to have a fever to 101.4.  UA negative, chest x-ray  negative, RPP positive for metapneumovirus.  This is likely source of  fever.  Patient otherwise with stable vitals, and does not appear acutely  ill. - Continue to monitor - Supportive care - Tylenol 650 mg q6h prn        Dementia [facility])     Patient presents with history of severe dementia.  Patient lives in  assisted living facility. -Delirium precautions -Assist w/ meals         Fall     Patient had an unwitnessed fall, and concern for hitting head, at  living facility.  Patient was sent to [facility] ED for evaluation, head CT  negative.  After speaking with facility, they note patient has had  multiple falls.  Concern for patient continuing anticoagulation with  history of falls. - Consider shared decision-making conversation about Eliquis      Chronic Problems: HTN: Continue atenolol  FEN/GI: Regular  diet VTE Prophylaxis: Apixiaban  Disposition: ALF  History of Present Illness:  Diane Robertson is a 89 y.o. female presenting with fever.  Per conversation with POA and facility, patient lives in an assisted living facility, with history of dementia.  Patient was found down this morning, with legs under some furniture and head near other furniture.  Facility had concern that patient had hit head and sent patient to ED for evaluation. Facility also mention that patient has had multiple falls. In the ED CT head was negative however patient had a fever to 101.4.  Blood cultures, UA, CXR, were collected and family medicine was called for admission.  Review Of Systems:  CP, SOB, back pain, abdominal pain  Pertinent Past Medical History: Severe Dementia Prot Cal Malnutrition - tends to not eat GERD HTN Hx PE and DVT Remainder reviewed in history tab.   Pertinent Past Surgical History: Diverticulitis - 21yr ago   Remainder reviewed in history tab.  Pertinent Social History: Tobacco use: No Alcohol use: Former, 3-4 glasses of wine a day, or 2-3 shots Other Substance use: None Lives with assisted living facility  Pertinent Family History: Father - Dementia, CVA  Remainder reviewed in history tab.   Important Outpatient Medications: Ferrous sulfate 325 mg daily Co-enzyme Q 10, 30 mg capsule Florastor 250 mg Protonix 40 mg  Red yeast rice 600 mg Zoloft 50 mg daily Atenolol 25  mg  Multi vitamin Eliquis 5 mg BID Famotidine 20mg  BID Lorazepam 0.5mg  BID Megastorl acetate 40 mg BID Mirtazipine 7.5 mg at bedtime Vanilla ensure BID Trazodone 25 mg prn q8h Tyle 350 q6h Zofran 4 mg prn q8h Remainder reviewed in medication history.   Objective: BP (!) 143/58 (BP Location: Right Arm)   Pulse 78   Temp 98.2 F (36.8 C) (Oral)   Resp 18   SpO2 98%  Exam: General: Elderly, frail, NAD Cardiovascular: RRR, NRMG Respiratory: CTABL, no wheezing, good WOB Gastrointestinal: Soft,  NTTP, non-distended Neuro: A&O to self   Labs:  CBC BMET  Recent Labs  Lab 06/01/23 0858  WBC 5.9  HGB 11.8*  HCT 34.9*  PLT 149*   Recent Labs  Lab 06/01/23 0858  NA 136  K 3.7  CL 106  CO2 21*  BUN 23  CREATININE 1.33*  GLUCOSE 95  CALCIUM 9.0     EKG: My own interpretation (not copied from electronic read)  NSR  QtC 449   Imaging Studies Performed:  CT Head Wo Contrast  Result Date: 06/01/2023 CLINICAL DATA:  Head trauma, minor (Age >= 65y) EXAM: CT HEAD WITHOUT CONTRAST TECHNIQUE: Contiguous axial images were obtained from the base of the skull through the vertex without intravenous contrast. RADIATION DOSE REDUCTION: This exam was performed according to the departmental dose-optimization program which includes automated exposure control, adjustment of the mA and/or kV according to patient size and/or use of iterative reconstruction technique. COMPARISON:  04/26/2023 FINDINGS: Brain: No evidence of acute infarction, hemorrhage, hydrocephalus, extra-axial collection or mass lesion/mass effect. Scattered low-density changes within the periventricular and subcortical white matter most compatible with chronic microvascular ischemic change. Mild diffuse cerebral volume loss. Vascular: Atherosclerotic calcifications involving the large vessels of the skull base. No unexpected hyperdense vessel. Skull: Normal. Negative for fracture or focal lesion. Sinuses/Orbits: No acute finding. Other: None. IMPRESSION: 1. No acute intracranial findings. 2. Chronic microvascular ischemic change and cerebral volume loss. Electronically Signed   By: Duanne Guess D.O.   On: 06/01/2023 09:54   DG Pelvis Portable  Result Date: 06/01/2023 CLINICAL DATA:  Unwitnessed fall. EXAM: PORTABLE PELVIS 1-2 VIEWS COMPARISON:  Apr 26, 2023. FINDINGS: Status post right total hip arthroplasty. There is no evidence of pelvic fracture or diastasis. No pelvic bone lesions are seen. IMPRESSION: No acute  abnormality seen. Electronically Signed   By: Lupita Raider M.D.   On: 06/01/2023 09:10   DG Chest Port 1 View  Result Date: 06/01/2023 CLINICAL DATA:  Fever, unwitnessed fall. EXAM: PORTABLE CHEST 1 VIEW COMPARISON:  Apr 26, 2023. FINDINGS: The heart size and mediastinal contours are within normal limits. Both lungs are clear. The visualized skeletal structures are unremarkable. IMPRESSION: No active disease. Electronically Signed   By: Lupita Raider M.D.   On: 06/01/2023 09:09       Bess Kinds, MD 06/01/2023, 9:39 PM PGY-2, [facility]  FPTS Intern pager: [phone], text pages welcome Secure chat group [facility] Teaching Service

## 2023-06-01 NOTE — ED Notes (Addendum)
Pt slid self down in bed several times, began pulling at lines;; when going to reposition pt, pt became agitated, combative with staff; green mitts applied

## 2023-06-02 ENCOUNTER — Encounter (HOSPITAL_COMMUNITY): Payer: Self-pay | Admitting: Family Medicine

## 2023-06-02 DIAGNOSIS — J123 Human metapneumovirus pneumonia: Secondary | ICD-10-CM | POA: Diagnosis not present

## 2023-06-02 LAB — CBC
HCT: 32.4 % — ABNORMAL LOW (ref 36.0–46.0)
Hemoglobin: 10.8 g/dL — ABNORMAL LOW (ref 12.0–15.0)
MCH: 30.9 pg (ref 26.0–34.0)
MCHC: 33.3 g/dL (ref 30.0–36.0)
MCV: 92.6 fL (ref 80.0–100.0)
Platelets: 150 10*3/uL (ref 150–400)
RBC: 3.5 MIL/uL — ABNORMAL LOW (ref 3.87–5.11)
RDW: 13.2 % (ref 11.5–15.5)
WBC: 7.7 10*3/uL (ref 4.0–10.5)
nRBC: 0 % (ref 0.0–0.2)

## 2023-06-02 LAB — BASIC METABOLIC PANEL
Anion gap: 7 (ref 5–15)
BUN: 13 mg/dL (ref 8–23)
CO2: 21 mmol/L — ABNORMAL LOW (ref 22–32)
Calcium: 8.7 mg/dL — ABNORMAL LOW (ref 8.9–10.3)
Chloride: 106 mmol/L (ref 98–111)
Creatinine, Ser: 1.1 mg/dL — ABNORMAL HIGH (ref 0.44–1.00)
GFR, Estimated: 47 mL/min — ABNORMAL LOW (ref >60)
Glucose, Bld: 97 mg/dL (ref 70–99)
Potassium: 3.3 mmol/L — ABNORMAL LOW (ref 3.5–5.1)
Sodium: 134 mmol/L — ABNORMAL LOW (ref 135–145)

## 2023-06-02 LAB — URINE CULTURE: Culture: NO GROWTH

## 2023-06-02 LAB — CULTURE, BLOOD (ROUTINE X 2)

## 2023-06-02 MED ORDER — POTASSIUM CHLORIDE 20 MEQ PO PACK
40.0000 meq | PACK | Freq: Once | ORAL | Status: AC
  Administered 2023-06-02: 40 meq via ORAL
  Filled 2023-06-02: qty 2

## 2023-06-02 MED ORDER — APIXABAN 5 MG PO TABS: 5.0000 mg | ORAL_TABLET | Freq: Two times a day (BID) | ORAL | Status: AC

## 2023-06-02 MED ORDER — ACETAMINOPHEN 325 MG PO TABS: 650.0000 mg | ORAL_TABLET | Freq: Four times a day (QID) | ORAL | Status: AC | PRN

## 2023-06-02 MED ORDER — MIRTAZAPINE 7.5 MG PO TABS: 7.5000 mg | ORAL_TABLET | Freq: Every day | ORAL | Status: AC

## 2023-06-02 MED ORDER — SERTRALINE HCL 50 MG PO TABS: 50.0000 mg | ORAL_TABLET | Freq: Every day | ORAL | Status: AC

## 2023-06-02 MED ORDER — LORAZEPAM 0.5 MG PO TABS: 0.5000 mg | ORAL_TABLET | Freq: Two times a day (BID) | ORAL | 0 refills | Status: AC

## 2023-06-02 MED ORDER — FERROUS SULFATE 325 (65 FE) MG PO TABS: 325.0000 mg | ORAL_TABLET | Freq: Every day | ORAL | 3 refills | Status: AC

## 2023-06-02 NOTE — Discharge Summary (Signed)
Family Medicine Teaching [facility] - Closed Discharge Summary  Patient name: Diane Robertson Medical record number: [id number] Date of birth: January 23, 1929 Age: 89 y.o. Gender: female Date of Admission: 06/01/2023  Date of Discharge: 06/02/23 Admitting Physician: Westley Chandler, MD  Primary Care Provider: Pcp, No Consultants: None  Indication for Hospitalization: Fall  Discharge Diagnoses/Problem List:  Principal Problem for Admission: Fall Other Problems addressed during stay:  Principal Problem:   Fall Active Problems:   Dementia (HCC)   Hypertension   Viral illness   Human metapneumovirus (hMPV) pneumonia    Brief Hospital Course:  Diane Robertson is a 89 y.o.female with a history of severe dementia, malnutrition, GERD, hypertension, history of PE/DVT who was admitted to the family medicine teaching Service at [facility] for fall in the setting of metapneumovirus. Her hospital course is detailed below:   Metapneumovirus pneumonia Admitted for observation after unwitnessed fall.  Noted to have fever 101.4.  Patient not septic, did not require oxygen requirement. Blood cultures were negative at 24hrs. Infectious workup grossly negative: UA negative, chest x-ray negative, RPP positive for metapneumovirus, and thought to be because of fever.  Treated with supportive care through to discharge.  Unwitnessed fall Head CT negative.  Thought to be due to severe dementia in the setting of chronic malnutrition and metapneumovirus.  Will require ongoing discussion of need for anticoagulation given thrombotic history.  Other chronic conditions were medically managed with home medications and formulary alternatives as necessary (hypertension)  PCP Follow-up Recommendations: 1.  Discussed need for continued anticoagulation with Eliquis. 2.  Recheck potassium in 1 week.   Disposition: Memory Care, [facility]  Discharge Condition: stable  Discharge Exam:  Vitals:   06/02/23 0419 06/02/23  0722  BP: (!) 130/58 108/60  Pulse: 68 74  Resp:  18  Temp: 98 F (36.7 C) 98.3 F (36.8 C)  SpO2: 99% 96%   Exam per Dr. Melissa Noon 06/02/23 General: Pleasant elderly female in no distress Cardiovascular: RRR Respiratory: Normal WOB on room air, no wheezing or crackles Abdomen: Soft, NTND Extremities: Bruising over left forearm, otherwise no visible deformities or lesions. Warm and well-perfused. No peripheral edema.  Significant Labs and Imaging:  Recent Labs  Lab 06/01/23 0858 06/02/23 0550  WBC 5.9 7.7  HGB 11.8* 10.8*  HCT 34.9* 32.4*  PLT 149* 150   Recent Labs  Lab 06/01/23 0858 06/02/23 0550  NA 136 134*  K 3.7 3.3*  CL 106 106  CO2 21* 21*  GLUCOSE 95 97  BUN 23 13  CREATININE 1.33* 1.10*  CALCIUM 9.0 8.7*  ALKPHOS 43  --   AST 20  --   ALT 18  --   ALBUMIN 3.2*  --    CT Head 1. No acute intracranial findings. 2. Chronic microvascular ischemic change and cerebral volume loss.   CXR - No active disease.  Results/Tests Pending at Time of Discharge: Bcx  Discharge Medications:  Allergies as of 06/02/2023       Reactions   Amoxicillin Rash, Other (See Comments)   Codeine Other (See Comments), Rash   headaches   Bee Venom Other (See Comments)   UNK reaction        Medication List     STOP taking these medications    levofloxacin 250 MG tablet Commonly known as: LEVAQUIN   traMADol 50 MG tablet Commonly known as: ULTRAM       TAKE these medications    acetaminophen 325 MG tablet Commonly known as: TYLENOL  Take 2 tablets (650 mg total) by mouth every 6 (six) hours as needed for fever or mild pain.   apixaban 5 MG Tabs tablet Commonly known as: ELIQUIS Take 1 tablet (5 mg total) by mouth 2 (two) times daily. What changed: Another medication with the same name was added. Make sure you understand how and when to take each.   apixaban 5 MG Tabs tablet Commonly known as: ELIQUIS Take 1 tablet (5 mg total) by mouth 2 (two) times  daily. What changed: You were already taking a medication with the same name, and this prescription was added. Make sure you understand how and when to take each.   atenolol 25 MG tablet Commonly known as: TENORMIN Take 25 mg by mouth daily.   cetirizine 10 MG tablet Commonly known as: ZYRTEC Take 10 mg by mouth daily as needed for allergies.   co-enzyme Q-10 30 MG capsule Take 30 mg by mouth daily.   famotidine 20 MG tablet Commonly known as: PEPCID Take 20 mg by mouth 2 (two) times daily.   ferrous sulfate 325 (65 FE) MG tablet Take 1 tablet (325 mg total) by mouth daily with breakfast. Start taking on: June 03, 2023   ipratropium 0.03 % nasal spray Commonly known as: ATROVENT Place 2 sprays into both nostrils every 12 (twelve) hours as needed for rhinitis.   lactose free nutrition Liqd Take 237 mLs by mouth 3 (three) times daily with meals.   LORazepam 0.5 MG tablet Commonly known as: ATIVAN Take 1 tablet (0.5 mg total) by mouth 2 (two) times daily.   mirtazapine 7.5 MG tablet Commonly known as: REMERON Take 1 tablet (7.5 mg total) by mouth at bedtime.   multivitamin with minerals Tabs tablet Take 1 tablet by mouth daily.   pantoprazole 40 MG tablet Commonly known as: PROTONIX Take 1 tablet (40 mg total) by mouth 2 (two) times daily.   saccharomyces boulardii 250 MG capsule Commonly known as: FLORASTOR Take 250 mg by mouth 2 (two) times daily.   sertraline 50 MG tablet Commonly known as: ZOLOFT Take 1 tablet (50 mg total) by mouth daily. Start taking on: June 03, 2023        Discharge Instructions: Please refer to Patient Instructions section of EMR for full details.  Patient was counseled important signs and symptoms that should prompt return to medical care, changes in medications, dietary instructions, activity restrictions, and follow up appointments.   Follow-Up Appointments:   Celine Mans, MD 06/02/2023, 11:07 AM PGY-1, [facility] Family  Medicine

## 2023-06-02 NOTE — Assessment & Plan Note (Addendum)
BP generally well controlled, 130s/80s. - Monitor VS - Continue Atenolol

## 2023-06-02 NOTE — Evaluation (Signed)
Occupational Therapy Evaluation Patient Details Name: Diane Robertson MRN: [id number] DOB: 28-Apr-1929 Today's Date: 06/02/2023   History of Present Illness Pt is a 89 y.o. fall presenting 6/22  from [facility] after unwitnessed fall with concern for head trauma at her living facility. CT head with no acute findings. UA and CXR negative. PMH significant for PE and DVT, HTN, dementia.   Clinical Impression   PTA, pt from [facility] where niece reports she was ambulatory within her memory care unit and received assist with all ADL. Upon eval, pt with poor command following and pleasantly confused, thus, greater success with self-initiated actions (such as getting back in bed). Pt presenting with generalized weakness, decreased balance, and endurance. Pt with productive cough during session. Pt performing ADL with mod A overall due to poor sequencing and able to transfer with min A today, but consistently self-initiating return to sit, thus further mobility deferred this session. Believe pt will function best in her familiar environment; pending staff ability to assist at memory care, may be able to return, otherwise will need SNF rehabilitation.      Recommendations for follow up therapy are one component of a multi-disciplinary discharge planning process, led by the attending physician.  Recommendations may be updated based on patient status, additional functional criteria and insurance authorization.   Assistance Recommended at Discharge Frequent or constant Supervision/Assistance  Patient can return home with the following A lot of help with walking and/or transfers;A lot of help with bathing/dressing/bathroom;Assistance with cooking/housework;Direct supervision/assist for medications management;Direct supervision/assist for financial management;Assist for transportation;Help with stairs or ramp for entrance    Functional Status Assessment  Patient has had a recent decline in their  functional status and demonstrates the ability to make significant improvements in function in a reasonable and predictable amount of time.  Equipment Recommendations  None recommended by OT    Recommendations for Other Services       Precautions / Restrictions Precautions Precautions: Fall      Mobility Bed Mobility Overal bed mobility: Needs Assistance Bed Mobility: Supine to Sit, Sit to Supine     Supine to sit: Max assist Sit to supine: Mod assist   General bed mobility comments: Max A for sequencing and cues to come to EOB as well as physical guidance. Mod A to return to bed. Pt able to bring legs into bed but poor orientation and difficulty repositioning self appropriately    Transfers Overall transfer level: Needs assistance Equipment used: Rolling walker (2 wheels) Transfers: Sit to/from Stand Sit to Stand: Min assist           General transfer comment: Min A for guidance throughout. pt with poor command following and would stand with hands on RW, but then self initiate sit. Pt not following commands to take steps.      Balance Overall balance assessment: Needs assistance Sitting-balance support: Feet supported Sitting balance-Leahy Scale: Fair Sitting balance - Comments: statically   Standing balance support: Bilateral upper extremity supported Standing balance-Leahy Scale: Poor Standing balance comment: reliant on external assist                           ADL either performed or assessed with clinical judgement   ADL Overall ADL's : Needs assistance/impaired Eating/Feeding: Set up;Cueing for sequencing   Grooming: Minimal assistance;Cueing for sequencing   Upper Body Bathing: Moderate assistance;Cueing for safety   Lower Body Bathing: Moderate assistance;Cueing for sequencing   Upper  Body Dressing : Moderate assistance;Cueing for sequencing;Maximal assistance   Lower Body Dressing: Moderate assistance;Cueing for sequencing   Toilet  Transfer: Cueing for sequencing;Minimal assistance;Rolling walker (2 wheels);Stand-pivot Statistician Details (indicate cue type and reason): Pt would lift feet as if to take steps 1-2 times, but then consistently self-initiating return to sit.         Functional mobility during ADLs: Minimal assistance;Rolling walker (2 wheels)       Vision Patient Visual Report:  (Pt unable to provide accurate report) Additional Comments: Suspect mild peripheral vision impairment consistent with dementia     Perception     Praxis      Pertinent Vitals/Pain Pain Assessment Pain Assessment: No/denies pain     Hand Dominance     Extremity/Trunk Assessment Upper Extremity Assessment Upper Extremity Assessment: Generalized weakness   Lower Extremity Assessment Lower Extremity Assessment: Generalized weakness       Communication Communication Communication: Expressive difficulties;Receptive difficulties (consistent with dementia difficulty finding words and following commands.)   Cognition Arousal/Alertness: Awake/alert Behavior During Therapy: WFL for tasks assessed/performed Overall Cognitive Status: History of cognitive impairments - at baseline                                 General Comments: dementia at baseline. Pt oriented to self only. Pt reporting things do not look right in her room, so some awareness that she is not in her typical location, but unaware of being at hospital despite re-orientation.     General Comments  VSS. Spoke with niece on the phone who reports pt was up OOB without assist on friday at her memory care    Exercises     Shoulder Instructions      Home Living Family/patient expects to be discharged to:: Skilled nursing facility                                 Additional Comments: [facility]      Prior Functioning/Environment Prior Level of Function : Needs assist             Mobility Comments:  Per niece on phone, pt ambulatory at [facility] with RW. ADLs Comments: Per niece on phone (labeled daughter in chart), pt requires assist with all ADL at baseline.        OT Problem List: Decreased strength;Decreased activity tolerance;Impaired balance (sitting and/or standing);Decreased coordination;Decreased cognition;Decreased knowledge of use of DME or AE;Decreased safety awareness      OT Treatment/Interventions: Self-care/ADL training;Therapeutic exercise;DME and/or AE instruction;Therapeutic activities;Patient/family education;Balance training    OT Goals(Current goals can be found in the care plan section) Acute Rehab OT Goals Patient Stated Goal: none stated OT Goal Formulation: With patient Time For Goal Achievement: 06/16/23 Potential to Achieve Goals: Good  OT Frequency: Min 1X/week    Co-evaluation              AM-PAC OT "6 Clicks" Daily Activity     Outcome Measure Help from another person eating meals?: A Little Help from another person taking care of personal grooming?: A Little Help from another person toileting, which includes using toliet, bedpan, or urinal?: A Lot Help from another person bathing (including washing, rinsing, drying)?: A Lot Help from another person to put on and taking off regular upper body clothing?: A Lot Help from another person to put on  and taking off regular lower body clothing?: A Lot 6 Click Score: 14   End of Session Equipment Utilized During Treatment: Rolling walker (2 wheels) Nurse Communication: Mobility status  Activity Tolerance: Patient tolerated treatment well Patient left: in bed;with call bell/phone within reach;with bed alarm set  OT Visit Diagnosis: Unsteadiness on feet (R26.81);Muscle weakness (generalized) (M62.81);Other symptoms and signs involving cognitive function;History of falling (Z91.81)                Time: 6269-4854 OT Time Calculation (min): 17 min Charges:  OT General Charges $OT Visit: 1  Visit OT Evaluation $OT Eval Moderate Complexity: 1 Mod  Tyler Deis, OTR/L [facility] Acute Rehabilitation Office: [phone]   Myrla Halsted 06/02/2023, 10:50 AM

## 2023-06-02 NOTE — TOC Transition Note (Signed)
Transition of Care [facility]) - CM/SW Discharge Note   Patient Details  Name: Diane Robertson MRN: [id number] Date of Birth: 1929/10/08  Transition of Care [facility]) CM/SW Contact:  Deatra Robinson, [place] Phone Number: 06/02/2023, 11:31 AM   Clinical Narrative:  Pt for dc back to [facility] ALF/Memory Care today. Spoke to [place] at [employer] who confirmed pt able to return. DC summary faxed to facility at [facility] request [phone]. Pt's HCPOA/niece Gayle aware of dc and reports agreeable. RN provided with number for report and PTAR arranged for transport. SW signing off at dc.   Dellie Burns, MSW, LCSW [phone] (coverage)       Final next level of care: Memory Care Barriers to Discharge: No Barriers Identified   Patient Goals and CMS Choice      Discharge Placement                  Patient to be transferred to facility by: PTAR Name of family member notified: Gayle/HCPOA/niece Patient and family notified of of transfer: 06/02/23  Discharge Plan and Services Additional resources added to the After Visit Summary for                                       Social Determinants of Health (SDOH) Interventions SDOH Screenings   Tobacco Use: Low Risk  (06/02/2023)     Readmission Risk Interventions     No data to display

## 2023-06-02 NOTE — Hospital Course (Addendum)
Diane Robertson is a 89 y.o.female with a history of severe dementia, malnutrition, GERD, hypertension, history of PE/DVT who was admitted to the family medicine teaching Service at [facility] for fall in the setting of metapneumovirus. Her hospital course is detailed below:   Metapneumovirus pneumonia Admitted for observation after unwitnessed fall.  Noted to have fever 101.4.  Patient not septic, did not require oxygen requirement. Blood cultures were negative at 24hrs. Infectious workup grossly negative: UA negative, chest x-ray negative, RPP positive for metapneumovirus, and thought to be because of fever.  Treated with supportive care through to discharge.  Unwitnessed fall Head CT negative.  Thought to be due to severe dementia in the setting of chronic malnutrition and metapneumovirus.  Will require ongoing discussion of need for anticoagulation given thrombotic history.  Other chronic conditions were medically managed with home medications and formulary alternatives as necessary (hypertension)  PCP Follow-up Recommendations: 1.  Discussed need for continued anticoagulation with Eliquis. 2.  Recheck potassium in 1 week.

## 2023-06-02 NOTE — Progress Notes (Signed)
     Daily Progress Note Intern Pager: [phone]  Patient name: Diane Robertson Medical record number: [id number] Date of birth: [birth year] Age: 89 y.o. Gender: female  Primary Care Provider: Pcp, No Consultants: None Code Status: Full  Pt Overview and Major Events to Date:  6/22: Admitted  Assessment and Plan: Diane Robertson is a pleasant 89 year-old female who presented after an unwitnessed fall with concern for head trauma at living facility. Pertinent PMH/PSH includes hx PE and DVT, HTN, cognitive decline/dementia.   Hospital Problem List      Hospital     * (Principal) Fall     Stable mental status this AM; pleasantly demented. Pupils PERRLA, head  atraumatic. Thinks she is in a church and that her age is 89 years old.  Oriented to self. - Need to have shared decision-making regarding Eliquis with history of  frequent falls - PT/OT - Fall precautions        Dementia [facility] [place])     Patient presents with history of severe dementia.  Patient lives in  assisted living facility. -Delirium precautions -Assist w/ meals         Hypertension     BP generally well controlled, 130s/80s. - Monitor VS - Continue Atenolol        Viral illness     + Metapnuemovirus. Afebrile overnight, asymptomatic. Hemodynamically stable and does not meet SIRS criteria. - Follow up urine culture - Follow up blood cultures - Supportive care - Tylenol 650 mg q6h prn      Mood disorder: Continue Zoloft 50 mg daily, Ativan 0.5 mg BID (consider weaning; elderly patient and medication on [facility] list) IDA: Continue ferrous sulfate 325 mg daily  FEN/GI: Regular PPx: On Eliquis Dispo: Return to living facility  Subjective:  Patient pleasantly resting in bed this morning. Denies any pain. Reports that she has two children, and that she is 89 years old. She identifies her current location as a church.  Objective: Temp:  [98 F (36.7 C)-101.4 F (38.6 C)] 98 F (36.7 C) (06/23 0419) Pulse  Rate:  [66-82] 68 (06/23 0419) Resp:  [15-24] 18 (06/22 2032) BP: (109-149)/(48-69) 130/58 (06/23 0419) SpO2:  [94 %-100 %] 99 % (06/23 0419) Physical Exam: General: Pleasant elderly female in no distress Cardiovascular: RRR Respiratory: Normal WOB on room air, no wheezing or crackles Abdomen: Soft, NTND Extremities: Bruising over left forearm, otherwise no visible deformities or lesions. Warm and well-perfused. No peripheral edema.  Laboratory: Most recent CBC Lab Results  Component Value Date   WBC 7.7 06/02/2023   HGB 10.8 (L) 06/02/2023   HCT 32.4 (L) 06/02/2023   MCV 92.6 06/02/2023   PLT 150 06/02/2023   Most recent BMP    Latest Ref Rng & Units 06/01/2023    8:58 AM  BMP  Glucose 70 - 99 mg/dL 95   BUN 8 - 23 mg/dL 23   Creatinine 8.65 - 1.00 mg/dL 7.84   Sodium 696 - 295 mmol/L 136   Potassium 3.5 - 5.1 mmol/L 3.7   Chloride 98 - 111 mmol/L 106   CO2 22 - 32 mmol/L 21   Calcium 8.9 - 10.3 mg/dL 9.0     Diane Dash, DO 06/02/2023, 6:28 AM  PGY-2, [facility] FPTS Intern pager: [phone], text pages welcome Secure chat group [facility] Teaching Service

## 2023-06-02 NOTE — Plan of Care (Signed)

## 2023-06-02 NOTE — Progress Notes (Signed)
PT Cancellation Note  Patient Details Name: Diane Robertson MRN: [id number] DOB: Sep 26, 1929   Cancelled Treatment:    Reason Eval/Treat Not Completed: PT screened, no needs identified, will sign off  Noted TOC has talked to ALF [facility] and they can take pt back today. Discharge orders are in. No PT eval needed.    Jerolyn Center, PT Acute Rehabilitation Services  Office [phone]  Zena Amos 06/02/2023, 11:44 AM

## 2023-06-02 NOTE — Progress Notes (Signed)
Call patient's niece Diane Robertson, who confirmed patient's date of birth. Provided update regarding patient's medical status. Answered all questions. Inquired about patient's code status. Niece reports that patient has DNR form at home that she is willing to email over to hospital. Also stated she is HCPOA for patient. This is also stated in prior note from Palliative Care of June of 2023. Did not find scan in media tab of paper work. To the best of her knowledge Mr. Diane Robertson states patient would want to be DNR. Will changed to DNR status and await paperwork.

## 2023-06-02 NOTE — Plan of Care (Signed)
  Problem: Nutrition: Goal: Adequate nutrition will be maintained Outcome: Progressing   Problem: Safety: Goal: Ability to remain free from injury will improve Outcome: Progressing   Problem: Skin Integrity: Goal: Risk for impaired skin integrity will decrease Outcome: Progressing   

## 2023-06-02 NOTE — Discharge Instructions (Addendum)
Dear Diane Robertson,   Thank you for letting us participate in your care! In this section, you will find a brief hospital admission summary of why you were admitted to the hospital, what happened during your admission, your diagnosis/diagnoses, and recommended follow up.  Primary diagnosis: Metapneumovirus pneumonia Treatment plan: No specific treatment plan is necessary given this is a virus.  Antibiotics not indicated.  CT of your head was negative in the setting of recent fall  POST-HOSPITAL & CARE INSTRUCTIONS We recommend following up with your PCP within 1 week from being discharged from the hospital. Please let PCP/Specialists know of any changes in medications that were made which you will be able to see in the medications section of this packet.  Thank you for choosing [facility]! Take care and be well!  Family Medicine Teaching Service Inpatient Team [place]  [facility]  [address] [place], [place] [address] [phone]

## 2023-06-03 LAB — CULTURE, BLOOD (ROUTINE X 2): Culture: NO GROWTH

## 2023-06-05 LAB — CULTURE, BLOOD (ROUTINE X 2): Culture: NO GROWTH

## 2023-06-06 LAB — CULTURE, BLOOD (ROUTINE X 2): Special Requests: ADEQUATE

## 2023-07-12 ENCOUNTER — Emergency Department (HOSPITAL_COMMUNITY): Payer: Medicare PPO

## 2023-07-12 ENCOUNTER — Emergency Department (HOSPITAL_COMMUNITY)
Admission: EM | Admit: 2023-07-12 | Discharge: 2023-07-12 | Disposition: A | Discharge status: Home / Self Care | Payer: Medicare PPO | Attending: Emergency Medicine | Admitting: Emergency Medicine

## 2023-07-12 DIAGNOSIS — F0390 Unspecified dementia without behavioral disturbance: Secondary | ICD-10-CM | POA: Insufficient documentation

## 2023-07-12 DIAGNOSIS — Z23 Encounter for immunization: Secondary | ICD-10-CM | POA: Diagnosis not present

## 2023-07-12 DIAGNOSIS — S0990XA Unspecified injury of head, initial encounter: Secondary | ICD-10-CM

## 2023-07-12 DIAGNOSIS — Z7901 Long term (current) use of anticoagulants: Secondary | ICD-10-CM | POA: Diagnosis not present

## 2023-07-12 DIAGNOSIS — W19XXXA Unspecified fall, initial encounter: Secondary | ICD-10-CM | POA: Insufficient documentation

## 2023-07-12 DIAGNOSIS — I10 Essential (primary) hypertension: Secondary | ICD-10-CM | POA: Diagnosis not present

## 2023-07-12 DIAGNOSIS — Z79899 Other long term (current) drug therapy: Secondary | ICD-10-CM | POA: Diagnosis not present

## 2023-07-12 DIAGNOSIS — S0101XA Laceration without foreign body of scalp, initial encounter: Secondary | ICD-10-CM | POA: Insufficient documentation

## 2023-07-12 DIAGNOSIS — E860 Dehydration: Secondary | ICD-10-CM | POA: Diagnosis not present

## 2023-07-12 LAB — COMPREHENSIVE METABOLIC PANEL
ALT: 26 U/L (ref 0–44)
AST: 22 U/L (ref 15–41)
Albumin: 3.1 g/dL — ABNORMAL LOW (ref 3.5–5.0)
Alkaline Phosphatase: 55 U/L (ref 38–126)
Anion gap: 7 (ref 5–15)
BUN: 17 mg/dL (ref 8–23)
CO2: 22 mmol/L (ref 22–32)
Calcium: 9.2 mg/dL (ref 8.9–10.3)
Chloride: 108 mmol/L (ref 98–111)
Creatinine, Ser: 1.42 mg/dL — ABNORMAL HIGH (ref 0.44–1.00)
GFR, Estimated: 35 mL/min — ABNORMAL LOW (ref >60)
Glucose, Bld: 91 mg/dL (ref 70–99)
Potassium: 3.8 mmol/L (ref 3.5–5.1)
Sodium: 137 mmol/L (ref 135–145)
Total Bilirubin: 0.7 mg/dL (ref 0.3–1.2)
Total Protein: 5.5 g/dL — ABNORMAL LOW (ref 6.5–8.1)

## 2023-07-12 LAB — CBC
HCT: 33.9 % — ABNORMAL LOW (ref 36.0–46.0)
Hemoglobin: 10.7 g/dL — ABNORMAL LOW (ref 12.0–15.0)
MCH: 30.2 pg (ref 26.0–34.0)
MCHC: 31.6 g/dL (ref 30.0–36.0)
MCV: 95.8 fL (ref 80.0–100.0)
Platelets: 210 10*3/uL (ref 150–400)
RBC: 3.54 MIL/uL — ABNORMAL LOW (ref 3.87–5.11)
RDW: 14.9 % (ref 11.5–15.5)
WBC: 5 10*3/uL (ref 4.0–10.5)
nRBC: 0 % (ref 0.0–0.2)

## 2023-07-12 MED ORDER — TETANUS-DIPHTH-ACELL PERTUSSIS 5-2.5-18.5 LF-MCG/0.5 IM SUSY
0.5000 mL | PREFILLED_SYRINGE | Freq: Once | INTRAMUSCULAR | Status: AC
  Administered 2023-07-12: 0.5 mL via INTRAMUSCULAR
  Filled 2023-07-12: qty 0.5

## 2023-07-12 MED ORDER — ACETAMINOPHEN 500 MG PO TABS
1000.0000 mg | ORAL_TABLET | Freq: Once | ORAL | Status: AC
  Administered 2023-07-12: 1000 mg via ORAL
  Filled 2023-07-12: qty 2

## 2023-07-12 MED ORDER — SODIUM CHLORIDE 0.9 % IV BOLUS
1000.0000 mL | Freq: Once | INTRAVENOUS | Status: AC
  Administered 2023-07-12: 1000 mL via INTRAVENOUS

## 2023-07-12 MED ORDER — LIDOCAINE HCL (PF) 1 % IJ SOLN
5.0000 mL | Freq: Once | INTRAMUSCULAR | Status: DC
  Filled 2023-07-12: qty 5

## 2023-07-12 NOTE — Progress Notes (Signed)
Orthopedic Tech Progress Note Patient Details:  Diane Robertson 29-Jun-1929 [id number] Level 2 Trauma. Not Needed Patient ID: Diane Robertson, female   DOB: 08/22/1929, 88 y.o.   MRN: [id number]  Diane Robertson 07/12/2023, 8:54 AM

## 2023-07-12 NOTE — ED Triage Notes (Signed)
Pt arrives via [facility] from [facility] for unwitnessed fall on thinners. Pt anticoagulated on Eliquis. Fall occurred sometime after 0330 this morning. Small laceration to posterior L side of head. Pt alert to person and place, which is reportedly baseline.

## 2023-07-12 NOTE — Progress Notes (Signed)
   07/12/23 0827  Spiritual Encounters  Type of Visit Declined chaplain visit  Reason for visit Trauma  OnCall Visit Yes   Chaplain responded to level 2 trauma. Patient declined chaplain visit.   Arlyce Dice, Chaplain Resident [phone]

## 2023-07-12 NOTE — Discharge Instructions (Signed)
We evaluated Diane Robertson for her fall.  Her testing was reassuring.  She did have a small cut on her head which we repaired with staples.  These should be removed in 10 to 14 days.  Her laboratory test also showed mild dehydration.  She should follow-up with her primary doctor for recheck of her kidney function.  She received fluids in the emergency department.  If she has any new symptoms, fevers, worsening confusion, or any other symptoms, you can bring her back to the emergency department for a recheck.

## 2023-07-12 NOTE — ED Provider Notes (Signed)
[place] [facility] AT [facility] Provider Note  CSN: [id number] Arrival date & time: 07/12/23 1324  Chief Complaint(s) Level 2 FOT  HPI Diane Robertson is a 89 y.o. female history of dementia, pulmonary embolism on Eliquis presenting to the emergency department with fall.  Patient had an unwitnessed fall sometime this morning.  Was not seen by staff.  History is limited due to dementia.  Patient reports she does not remember circumstances of fall but currently feels well.  She denies any headaches, neck pain, chest pain, pain in her arms or legs, back pain and reports that she feels at baseline.   Past Medical History Past Medical History:  Diagnosis Date   Diverticulitis    DVT (deep venous thrombosis) (HCC)    Hypertension    Pulmonary embolism [facility])    Patient Active Problem List   Diagnosis Date Noted   Human metapneumovirus (hMPV) pneumonia 06/02/2023   Viral illness 06/01/2023   Fall 06/01/2023   Protein-calorie malnutrition, severe 05/28/2022   Small bowel mass    Dementia (HCC)    Hypertension    Acute deep vein thrombosis (DVT) of both lower extremities (HCC)    Pulmonary embolism (HCC) 05/23/2022   Home Medication(s) Prior to Admission medications   Medication Sig Start Date End Date Taking? Authorizing Provider  megestrol (MEGACE) 40 MG tablet Take 40 mg by mouth 2 (two) times daily. 06/17/23  Yes [provider]  acetaminophen (TYLENOL) 325 MG tablet Take 2 tablets (650 mg total) by mouth every 6 (six) hours as needed for fever or mild pain. 06/02/23   Celine Mans, MD  apixaban (ELIQUIS) 5 MG TABS tablet Take 1 tablet (5 mg total) by mouth 2 (two) times daily. 06/06/22   Joseph Art, DO  apixaban (ELIQUIS) 5 MG TABS tablet Take 1 tablet (5 mg total) by mouth 2 (two) times daily. 06/02/23   Celine Mans, MD  atenolol (TENORMIN) 25 MG tablet Take 25 mg by mouth daily.    [provider]  azithromycin (ZITHROMAX) 250 MG  tablet Take 250 mg by mouth as directed. 06/18/23   [provider]  benzonatate (TESSALON) 100 MG capsule Take 100 mg by mouth 3 (three) times daily. 06/25/23   [provider]  cephALEXin (KEFLEX) 500 MG capsule Take 500 mg by mouth 3 (three) times daily. 06/04/23   [provider]  cetirizine (ZYRTEC) 10 MG tablet Take 10 mg by mouth daily as needed for allergies.    [provider]  co-enzyme Q-10 30 MG capsule Take 30 mg by mouth daily.    [provider]  famotidine (PEPCID) 20 MG tablet Take 20 mg by mouth 2 (two) times daily.    [provider]  ferrous sulfate 325 (65 FE) MG tablet Take 1 tablet (325 mg total) by mouth daily with breakfast. 06/03/23   Celine Mans, MD  GERI-TUSSIN 100 MG/5ML liquid Take 10 mLs by mouth every 6 (six) hours. 06/05/23   [provider]  ipratropium (ATROVENT) 0.03 % nasal spray Place 2 sprays into both nostrils every 12 (twelve) hours as needed for rhinitis.    [provider]  lactose free nutrition (BOOST PLUS) LIQD Take 237 mLs by mouth 3 (three) times daily with meals. 06/06/22   Joseph Art, DO  LORazepam (ATIVAN) 0.5 MG tablet Take 1 tablet (0.5 mg total) by mouth 2 (two) times daily. 06/02/23   Celine Mans, MD  mirtazapine (REMERON) 7.5 MG tablet Take  1 tablet (7.5 mg total) by mouth at bedtime. 06/02/23   Celine Mans, MD  Multiple Vitamin (MULTIVITAMIN WITH MINERALS) TABS tablet Take 1 tablet by mouth daily. 06/07/22   Joseph Art, DO  pantoprazole (PROTONIX) 40 MG tablet Take 1 tablet (40 mg total) by mouth 2 (two) times daily. 06/06/22   Joseph Art, DO  saccharomyces boulardii (FLORASTOR) 250 MG capsule Take 250 mg by mouth 2 (two) times daily.    [provider]  sertraline (ZOLOFT) 50 MG tablet Take 1 tablet (50 mg total) by mouth daily. 06/03/23   Celine Mans, MD                                                                                                                                     Past Surgical History Past Surgical History:  Procedure Laterality Date   BOWEL RESECTION N/A 05/25/2022   Procedure: SMALL BOWEL RESECTION WITH DIVERTICULA;  Surgeon: Gaynelle Adu, MD;  Location: [facility] OR;  Service: General;  Laterality: N/A;   EYE SURGERY     Bilateral implants   LAPAROTOMY N/A 05/25/2022   Procedure: EXPLORATORY LAPAROTOMY;  Surgeon: Gaynelle Adu, MD;  Location: [facility] OR;  Service: General;  Laterality: N/A;   TONSILLECTOMY     Family History No family history on file.  Social History Social History   Tobacco Use   Smoking status: Never   Smokeless tobacco: Never  Vaping Use   Vaping status: Never Used  Substance Use Topics   Alcohol use: Not Currently   Drug use: Never   Allergies Amoxicillin, Ciprofloxacin, Codeine, Glucose, and Bee venom  Review of Systems Review of Systems  All other systems reviewed and are negative.   Physical Exam Vital Signs  I have reviewed the triage vital signs BP 138/76   Pulse 88   Temp (!) 97.2 F (36.2 C)   Resp 16   Ht 5\' 4"  (1.626 m)   Wt 47.6 kg   SpO2 99%   BMI 18.02 kg/m  Physical Exam Vitals and nursing note reviewed.  Constitutional:      General: She is not in acute distress.    Appearance: She is well-developed.  HENT:     Head: Normocephalic.     Comments: Approximately 1 cm left scalp laceration    Mouth/Throat:     Mouth: Mucous membranes are moist.  Eyes:     Pupils: Pupils are equal, round, and reactive to light.  Cardiovascular:     Rate and Rhythm: Normal rate and regular rhythm.     Heart sounds: No murmur heard. Pulmonary:     Effort: Pulmonary effort is normal. No respiratory distress.     Breath sounds: Normal breath sounds.  Abdominal:     General: Abdomen is flat.     Palpations: Abdomen is soft.     Tenderness: There is no abdominal tenderness.  Musculoskeletal:  General: No tenderness.     Right lower leg: No edema.     Left  lower leg: No edema.     Comments: No midline C, T, L-spine tenderness.  No chest wall tenderness or crepitus.  Full active range of motion of the bilateral upper and lower extremities without focal tenderness or deformity  Skin:    General: Skin is warm and dry.  Neurological:     General: No focal deficit present.     Mental Status: She is alert. Mental status is at baseline.     Comments: Moves all 4 extremities equally, cranial nerves normal, oriented to self and place but not to situation or time  Psychiatric:        Mood and Affect: Mood normal.        Behavior: Behavior normal.     ED Results and Treatments Labs (all labs ordered are listed, but only abnormal results are displayed) Labs Reviewed  COMPREHENSIVE METABOLIC PANEL - Abnormal; Notable for the following components:      Result Value   Creatinine, Ser 1.42 (*)    Total Protein 5.5 (*)    Albumin 3.1 (*)    GFR, Estimated 35 (*)    All other components within normal limits  CBC - Abnormal; Notable for the following components:   RBC 3.54 (*)    Hemoglobin 10.7 (*)    HCT 33.9 (*)    All other components within normal limits                                                                                                                          Radiology CT HEAD WO CONTRAST  Result Date: 07/12/2023 CLINICAL DATA:  Unwitnessed fall, Eliquis EXAM: CT HEAD WITHOUT CONTRAST CT CERVICAL SPINE WITHOUT CONTRAST TECHNIQUE: Multidetector CT imaging of the head and cervical spine was performed following the standard protocol without intravenous contrast. Multiplanar CT image reconstructions of the cervical spine were also generated. RADIATION DOSE REDUCTION: This exam was performed according to the departmental dose-optimization program which includes automated exposure control, adjustment of the mA and/or kV according to patient size and/or use of iterative reconstruction technique. COMPARISON:  04/26/2023 FINDINGS: CT HEAD  FINDINGS Brain: No evidence of acute infarction, hemorrhage, hydrocephalus, extra-axial collection or mass lesion/mass effect. Periventricular and deep white matter hypodensity. Vascular: No hyperdense vessel or unexpected calcification. Skull: Normal. Negative for fracture or focal lesion. Sinuses/Orbits: No acute finding. Other: None. CT CERVICAL SPINE FINDINGS Alignment: Normal. Skull base and vertebrae: No acute fracture. No primary bone lesion or focal pathologic process. Soft tissues and spinal canal: No prevertebral fluid or swelling. No visible canal hematoma. Disc levels: Mild multilevel disc space height loss and osteophytosis of the cervical spine. Upper chest: Negative. Other: None. IMPRESSION: 1. No acute intracranial pathology. Small-vessel white matter disease. 2. No fracture or static subluxation of the cervical spine. 3. Mild multilevel disc degenerative disease of the cervical spine. Electronically Signed   By:  Jearld Lesch M.D.   On: 07/12/2023 09:09   CT CERVICAL SPINE WO CONTRAST  Result Date: 07/12/2023 CLINICAL DATA:  Unwitnessed fall, Eliquis EXAM: CT HEAD WITHOUT CONTRAST CT CERVICAL SPINE WITHOUT CONTRAST TECHNIQUE: Multidetector CT imaging of the head and cervical spine was performed following the standard protocol without intravenous contrast. Multiplanar CT image reconstructions of the cervical spine were also generated. RADIATION DOSE REDUCTION: This exam was performed according to the departmental dose-optimization program which includes automated exposure control, adjustment of the mA and/or kV according to patient size and/or use of iterative reconstruction technique. COMPARISON:  04/26/2023 FINDINGS: CT HEAD FINDINGS Brain: No evidence of acute infarction, hemorrhage, hydrocephalus, extra-axial collection or mass lesion/mass effect. Periventricular and deep white matter hypodensity. Vascular: No hyperdense vessel or unexpected calcification. Skull: Normal. Negative for fracture  or focal lesion. Sinuses/Orbits: No acute finding. Other: None. CT CERVICAL SPINE FINDINGS Alignment: Normal. Skull base and vertebrae: No acute fracture. No primary bone lesion or focal pathologic process. Soft tissues and spinal canal: No prevertebral fluid or swelling. No visible canal hematoma. Disc levels: Mild multilevel disc space height loss and osteophytosis of the cervical spine. Upper chest: Negative. Other: None. IMPRESSION: 1. No acute intracranial pathology. Small-vessel white matter disease. 2. No fracture or static subluxation of the cervical spine. 3. Mild multilevel disc degenerative disease of the cervical spine. Electronically Signed   By: Jearld Lesch M.D.   On: 07/12/2023 09:09   DG Pelvis Portable  Result Date: 07/12/2023 CLINICAL DATA:  trauma EXAM: PORTABLE PELVIS 1-2 VIEWS COMPARISON:  06/01/2023. FINDINGS: Pelvis is intact with normal and symmetric sacroiliac joints. No acute fracture or dislocation. No aggressive osseous lesion. Visualized sacral arcuate lines are unremarkable. Unremarkable symphysis pubis. There are mild degenerative changes of the left hip joint without significant joint space narrowing. Osteophytosis of the superior acetabulum. Redemonstration of right total hip arthroplasty The hardware is intact. No periprosthetic fracture or lucency. No interval change in alignment, when compared to the available recent prior examination. No radiopaque foreign bodies. IMPRESSION: 1. No acute fracture or dislocation. 2. Stable alignment of right total hip arthroplasty. Electronically Signed   By: Jules Schick M.D.   On: 07/12/2023 08:39   DG Chest Portable 1 View  Result Date: 07/12/2023 CLINICAL DATA:  trauma EXAM: PORTABLE CHEST 1 VIEW COMPARISON:  06/01/2023. FINDINGS: Bilateral lung fields are clear. Bilateral costophrenic angles are clear. Normal cardio-mediastinal silhouette. Mild compression deformity of T11 vertebrae, of indeterminate age but new since the prior CT  scan from 06/05/2022. Otherwise, no acute osseous abnormalities. The soft tissues are within normal limits. IMPRESSION: 1. No active cardiopulmonary disease. 2. Mild compression deformity of T11 vertebrae, of indeterminate age but new since the prior CT scan from 06/05/2022. Electronically Signed   By: Jules Schick M.D.   On: 07/12/2023 08:38    Pertinent labs & imaging results that were available during my care of the patient were reviewed by me and considered in my medical decision making (see MDM for details).  Medications Ordered in ED Medications  Tdap (BOOSTRIX) injection 0.5 mL (0.5 mLs Intramuscular Given 07/12/23 0848)  acetaminophen (TYLENOL) tablet 1,000 mg (1,000 mg Oral Given 07/12/23 0847)  sodium chloride 0.9 % bolus 1,000 mL (0 mLs Intravenous Stopped 07/12/23 1016)  Procedures .Marland KitchenLaceration Repair  Date/Time: 07/13/2023 7:42 AM  Performed by: Lonell Grandchild, MD Authorized by: Lonell Grandchild, MD   Consent:    Consent obtained:  Verbal   Consent given by:  Patient   Risks discussed:  Infection, need for additional repair, nerve damage, poor wound healing, poor cosmetic result, pain, retained foreign body, tendon damage and vascular damage   Alternatives discussed:  No treatment Universal protocol:    Patient identity confirmed:  Arm band and verbally with patient Anesthesia:    Anesthesia method:  Local infiltration   Local anesthetic:  Lidocaine 1% w/o epi Laceration details:    Location:  Scalp   Scalp location:  L parietal   Length (cm):  1 Exploration:    Limited defect created (wound extended): no     Wound exploration: wound explored through full range of motion and entire depth of wound visualized     Wound extent: areolar tissue not violated, fascia not violated, no foreign body, no signs of injury, no nerve damage, no tendon  damage, no underlying fracture and no vascular damage     Contaminated: no   Treatment:    Area cleansed with:  Saline   Amount of cleaning:  Standard   Irrigation solution:  Sterile saline   Debridement:  None   Undermining:  None   Scar revision: no   Skin repair:    Repair method:  Staples   Number of staples:  2 Approximation:    Approximation:  Close Repair type:    Repair type:  Simple Post-procedure details:    Dressing:  Open (no dressing)   Procedure completion:  Tolerated well, no immediate complications   (including critical care time)  Medical Decision Making / ED Course   MDM:  89 year old female with history of dementia, pulmonary embolism presenting with fall.  Patient is overall very well-appearing.  She does have dementia and is only oriented to person and place.  Does not remember circumstances of fall.  No evidence of significant traumatic injury beyond small scalp laceration.  Given patient is on Eliquis will obtain CT head, CT neck.  Will check basic labs.  Will need wound irrigation, tetanus and wound repair.  If workup is reassuring and patient able ambulate anticipate discharge back to facility.        Additional history obtained: -Additional history obtained from ems -External records from outside source obtained and reviewed including: Chart review including previous notes, labs, imaging, consultation notes including previous ER visits for falls   Lab Tests: -I ordered, reviewed, and interpreted labs.   The pertinent results include:   Labs Reviewed  COMPREHENSIVE METABOLIC PANEL - Abnormal; Notable for the following components:      Result Value   Creatinine, Ser 1.42 (*)    Total Protein 5.5 (*)    Albumin 3.1 (*)    GFR, Estimated 35 (*)    All other components within normal limits  CBC - Abnormal; Notable for the following components:   RBC 3.54 (*)    Hemoglobin 10.7 (*)    HCT 33.9 (*)    All other components within normal  limits    Notable for mild AKI, patient received fluids.   EKG   EKG Interpretation Date/Time:  Friday July 12 2023 08:16:53 EDT Ventricular Rate:  59 PR Interval:  47 QRS Duration:  217 QT Interval:  407 QTC Calculation: 404 R Axis:   55  Text Interpretation: Sinus rhythm Poor R wave progression  Confirmed by Alvino Blood ([id number]) on 07/12/2023 8:36:00 AM         Imaging Studies ordered: I ordered imaging studies including CT head, CT neck On my interpretation imaging demonstrates no acute process I independently visualized and interpreted imaging. I agree with the radiologist interpretation   Medicines ordered and prescription drug management: Meds ordered this encounter  Medications   Tdap (BOOSTRIX) injection 0.5 mL   acetaminophen (TYLENOL) tablet 1,000 mg   sodium chloride 0.9 % bolus 1,000 mL   DISCONTD: lidocaine (PF) (XYLOCAINE) 1 % injection 5 mL    -I have reviewed the patients home medicines and have made adjustments as needed   Cardiac Monitoring: The patient was maintained on a cardiac monitor.  I personally viewed and interpreted the cardiac monitored which showed an underlying rhythm of: NSR   Reevaluation: After the interventions noted above, I reevaluated the patient and found that their symptoms have improved  Co morbidities that complicate the patient evaluation  Past Medical History:  Diagnosis Date   Diverticulitis    DVT (deep venous thrombosis) (HCC)    Hypertension    Pulmonary embolism (HCC)       Dispostion: Disposition decision including need for hospitalization was considered, and patient discharged from emergency department.    Final Clinical Impression(s) / ED Diagnoses Final diagnoses:  Injury of head, initial encounter  Laceration of scalp, initial encounter  Dehydration     This chart was dictated using voice recognition software.  Despite best efforts to proofread,  errors can occur which can change the  documentation meaning.    Lonell Grandchild, MD 07/13/23 [phone]

## 2023-09-05 ENCOUNTER — Ambulatory Visit: Payer: Medicare PPO | Admitting: Podiatry

## 2023-09-05 DIAGNOSIS — M79675 Pain in left toe(s): Secondary | ICD-10-CM

## 2023-09-05 DIAGNOSIS — M79674 Pain in right toe(s): Secondary | ICD-10-CM

## 2023-09-05 DIAGNOSIS — B351 Tinea unguium: Secondary | ICD-10-CM

## 2023-09-05 DIAGNOSIS — F0390 Unspecified dementia without behavioral disturbance: Secondary | ICD-10-CM

## 2023-09-05 NOTE — Progress Notes (Signed)
This patient returns to my office for at risk foot care.  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  This patient is unable to cut nails herself since the patient cannot reach her nails.These nails are painful walking and wearing shoes.  This patient presents for at risk foot care today.  General Appearance  Alert, conversant and in no acute stress.  Vascular  Dorsalis pedis and posterior tibial  pulses are  weakly palpable  bilaterally.  Capillary return is within normal limits  bilaterally. Temperature is within normal limits  bilaterally.  Neurologic  Senn-Weinstein monofilament wire test within normal limits  bilaterally. Muscle power within normal limits bilaterally.  Nails Thick disfigured discolored nails with subungual debris  from hallux to fifth toes bilaterally. No evidence of bacterial infection or drainage bilaterally.  Orthopedic  No limitations of motion  feet .  No crepitus or effusions noted.  No bony pathology or digital deformities noted.  Skin  normotropic skin with no porokeratosis noted bilaterally.  No signs of infections or ulcers noted.     Onychomycosis  Pain in right toes  Pain in left toes  Consent was obtained for treatment procedures.   Mechanical debridement of nails 1-5  bilaterally performed with a nail nipper.  Filed with dremel without incident.    Return office visit     3 months                 Told patient to return for periodic foot care and evaluation due to potential at risk complications.   Helane Gunther DPM

## 2023-10-06 ENCOUNTER — Emergency Department (HOSPITAL_COMMUNITY): Payer: Medicare PPO

## 2023-10-06 ENCOUNTER — Emergency Department (HOSPITAL_COMMUNITY)
Admission: EM | Admit: 2023-10-06 | Discharge: 2023-10-06 | Disposition: A | Discharge status: Home / Self Care | Payer: Medicare PPO

## 2023-10-06 DIAGNOSIS — R1084 Generalized abdominal pain: Secondary | ICD-10-CM | POA: Diagnosis not present

## 2023-10-06 DIAGNOSIS — R350 Frequency of micturition: Secondary | ICD-10-CM | POA: Insufficient documentation

## 2023-10-06 DIAGNOSIS — F0390 Unspecified dementia without behavioral disturbance: Secondary | ICD-10-CM | POA: Insufficient documentation

## 2023-10-06 DIAGNOSIS — Z7901 Long term (current) use of anticoagulants: Secondary | ICD-10-CM | POA: Diagnosis not present

## 2023-10-06 DIAGNOSIS — R3915 Urgency of urination: Secondary | ICD-10-CM | POA: Diagnosis not present

## 2023-10-06 DIAGNOSIS — R109 Unspecified abdominal pain: Secondary | ICD-10-CM | POA: Diagnosis present

## 2023-10-06 LAB — URINALYSIS, ROUTINE W REFLEX MICROSCOPIC
Bilirubin Urine: NEGATIVE
Glucose, UA: NEGATIVE mg/dL
Hgb urine dipstick: NEGATIVE
Ketones, ur: NEGATIVE mg/dL
Leukocytes,Ua: NEGATIVE
Nitrite: NEGATIVE
Protein, ur: NEGATIVE mg/dL
Specific Gravity, Urine: 1.019 (ref 1.005–1.030)
pH: 7 (ref 5.0–8.0)

## 2023-10-06 LAB — BASIC METABOLIC PANEL
Anion gap: 6 (ref 5–15)
BUN: 32 mg/dL — ABNORMAL HIGH (ref 8–23)
CO2: 25 mmol/L (ref 22–32)
Calcium: 9.5 mg/dL (ref 8.9–10.3)
Chloride: 107 mmol/L (ref 98–111)
Creatinine, Ser: 1.5 mg/dL — ABNORMAL HIGH (ref 0.44–1.00)
GFR, Estimated: 33 mL/min — ABNORMAL LOW (ref >60)
Glucose, Bld: 97 mg/dL (ref 70–99)
Potassium: 4.7 mmol/L (ref 3.5–5.1)
Sodium: 138 mmol/L (ref 135–145)

## 2023-10-06 LAB — CBC
HCT: 35.9 % — ABNORMAL LOW (ref 36.0–46.0)
Hemoglobin: 11.9 g/dL — ABNORMAL LOW (ref 12.0–15.0)
MCH: 32 pg (ref 26.0–34.0)
MCHC: 33.1 g/dL (ref 30.0–36.0)
MCV: 96.5 fL (ref 80.0–100.0)
Platelets: 221 10*3/uL (ref 150–400)
RBC: 3.72 MIL/uL — ABNORMAL LOW (ref 3.87–5.11)
RDW: 13.2 % (ref 11.5–15.5)
WBC: 6.1 10*3/uL (ref 4.0–10.5)
nRBC: 0 % (ref 0.0–0.2)

## 2023-10-06 LAB — LIPASE, BLOOD: Lipase: 49 U/L (ref 11–51)

## 2023-10-06 LAB — TROPONIN I (HIGH SENSITIVITY): Troponin I (High Sensitivity): 6 ng/L (ref <18)

## 2023-10-06 NOTE — ED Notes (Signed)
Upon my arrival to the unit, pt getting out of bed unassisted. With assist x2, pt repositioned in the bed in trendelenburg position. Bed alarm activated. Pt provided with redirection, but non compliant due to confusion from dementia.

## 2023-10-06 NOTE — ED Provider Notes (Signed)
[facility] AT [facility] Provider Note   CSN: [id number] Arrival date & time: 10/06/23  1606     History  Chief Complaint  Patient presents with   Abdominal Pain    Diane Robertson is a 89 y.o. female.  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  Medics apparently arrived and the patient was denying any chest pain.  She was apparently complaining of abdominal pain at that time.  The patient is currently denying any symptoms and does not know why she is here.  She does have advanced dementia at baseline.  There was concern for possible urinary tract infection due to increased urinary frequency and urgency per nursing home staff.   Abdominal Pain      Home Medications Prior to Admission medications   Medication Sig Start Date End Date Taking? Authorizing Provider  acetaminophen (TYLENOL) 325 MG tablet Take 2 tablets (650 mg total) by mouth every 6 (six) hours as needed for fever or mild pain. 06/02/23   Celine Mans, MD  apixaban (ELIQUIS) 5 MG TABS tablet Take 1 tablet (5 mg total) by mouth 2 (two) times daily. 06/06/22   Joseph Art, DO  apixaban (ELIQUIS) 5 MG TABS tablet Take 1 tablet (5 mg total) by mouth 2 (two) times daily. 06/02/23   Celine Mans, MD  atenolol (TENORMIN) 25 MG tablet Take 25 mg by mouth daily.    [provider]  azithromycin (ZITHROMAX) 250 MG tablet Take 250 mg by mouth as directed. 06/18/23   [provider]  benzonatate (TESSALON) 100 MG capsule Take 100 mg by mouth 3 (three) times daily. 06/25/23   [provider]  cephALEXin (KEFLEX) 500 MG capsule Take 500 mg by mouth 3 (three) times daily. 06/04/23   [provider]  cetirizine (ZYRTEC) 10 MG tablet Take 10 mg by mouth daily as needed for allergies.    [provider]  co-enzyme Q-10  30 MG capsule Take 30 mg by mouth daily.    [provider]  famotidine (PEPCID) 20 MG tablet Take 20 mg by mouth 2 (two) times daily.    [provider]  ferrous sulfate 325 (65 FE) MG tablet Take 1 tablet (325 mg total) by mouth daily with breakfast. 06/03/23   Celine Mans, MD  GERI-TUSSIN 100 MG/5ML liquid Take 10 mLs by mouth every 6 (six) hours. 06/05/23   [provider]  ipratropium (ATROVENT) 0.03 % nasal spray Place 2 sprays into both nostrils every 12 (twelve) hours as needed for rhinitis.    [provider]  lactose free nutrition (BOOST PLUS) LIQD Take 237 mLs by mouth 3 (three) times daily with meals. 06/06/22   Joseph Art, DO  LORazepam (ATIVAN) 0.5 MG tablet Take 1 tablet (0.5 mg total) by mouth 2 (two) times daily. 06/02/23   Celine Mans, MD  megestrol (MEGACE) 40 MG tablet Take 40 mg by mouth 2 (two) times daily. 06/17/23   [provider]  mirtazapine (REMERON) 7.5 MG tablet Take 1 tablet (7.5 mg total) by mouth at bedtime. 06/02/23   Celine Mans, MD  Multiple Vitamin (MULTIVITAMIN WITH MINERALS) TABS tablet Take 1 tablet by mouth daily. 06/07/22   Joseph Art, DO  pantoprazole (PROTONIX) 40 MG tablet Take 1 tablet (40 mg total) by mouth 2 (two) times daily. 06/06/22   Marlin Canary  U, DO  saccharomyces boulardii (FLORASTOR) 250 MG capsule Take 250 mg by mouth 2 (two) times daily.    [provider]  sertraline (ZOLOFT) 50 MG tablet Take 1 tablet (50 mg total) by mouth daily. 06/03/23   Celine Mans, MD      Allergies    Amoxicillin, Ciprofloxacin, Codeine, Glucose, and Bee venom    Review of Systems   Review of Systems  Reason unable to perform ROS: Advanced demenia.  Gastrointestinal:  Positive for abdominal pain.    Physical Exam Updated Vital Signs BP (!) 142/73 (BP Location: Left Arm)   Pulse 65   Temp 97.9 F (36.6 C) (Oral)   Resp 18   Ht 5\' 4"  (1.626 m)   Wt 47.6 kg   SpO2 100%   BMI  18.01 kg/m  Physical Exam Vitals and nursing note reviewed.   Gen: NAD Eyes: PERRL, EOMI HEENT: no oropharyngeal swelling Neck: trachea midline Resp: clear to auscultation bilaterally Card: RRR, no murmurs, rubs, or gallops Abd: Mild tenderness in the periumbilical region with no guarding or rebound Extremities: no calf tenderness, no edema Vascular: 2+ radial pulses bilaterally, 2+ DP pulses bilaterally Skin: no rashes Psyc: acting appropriately   ED Results / Procedures / Treatments   Labs (all labs ordered are listed, but only abnormal results are displayed) Labs Reviewed  BASIC METABOLIC PANEL - Abnormal; Notable for the following components:      Result Value   BUN 32 (*)    Creatinine, Ser 1.50 (*)    GFR, Estimated 33 (*)    All other components within normal limits  CBC - Abnormal; Notable for the following components:   RBC 3.72 (*)    Hemoglobin 11.9 (*)    HCT 35.9 (*)    All other components within normal limits  URINALYSIS, ROUTINE W REFLEX MICROSCOPIC  LIPASE, BLOOD  TROPONIN I (HIGH SENSITIVITY)    EKG None  Radiology CT ABDOMEN PELVIS WO CONTRAST  Result Date: 10/06/2023 CLINICAL DATA:  Acute abdominal pain. EXAM: CT ABDOMEN AND PELVIS WITHOUT CONTRAST TECHNIQUE: Multidetector CT imaging of the abdomen and pelvis was performed following the standard protocol without IV contrast. RADIATION DOSE REDUCTION: This exam was performed according to the departmental dose-optimization program which includes automated exposure control, adjustment of the mA and/or kV according to patient size and/or use of iterative reconstruction technique. COMPARISON:  06/05/2022 FINDINGS: Lower chest: The lung bases are clear of acute process. No pleural effusion or pulmonary lesions. The heart is normal in size. No pericardial effusion. The distal esophagus and aorta are unremarkable. Hepatobiliary: Stable small right hepatic lobe cyst. No worrisome hepatic lesions or intrahepatic  biliary dilatation. The gallbladder is unremarkable. No common bile duct dilatation. Pancreas: Unremarkable. No pancreatic ductal dilatation or surrounding inflammatory changes. Spleen: Scattered calcified granulomas.  No splenic lesions. Adrenals/Urinary Tract: The adrenal glands are normal. Small bilateral renal cysts requiring any further imaging evaluation or follow-up. No worrisome renal lesions, renal calculi or hydronephrosis. The bladder is unremarkable. Stomach/Bowel: The stomach, duodenum, small bowel and colon are grossly normal without oral contrast. No inflammatory changes, mass lesions or obstructive findings. Scattered colonic diverticulosis but no findings for acute diverticulitis. Evidence of prior bowel surgery. Vascular/Lymphatic: Stable age related vascular disease. No aneurysm. No mesenteric or retroperitoneal mass or adenopathy. Reproductive: The uterus and ovaries are unremarkable. Other: No pelvic mass or adenopathy. No free pelvic fluid collections. No inguinal mass or adenopathy. No abdominal wall hernia or subcutaneous lesions. Musculoskeletal: Artifact related  to a right hip prosthesis. No acute bony findings or worrisome bone lesions. IMPRESSION: 1. No acute abdominal/pelvic findings, mass lesions or adenopathy. 2. Colonic diverticulosis but no findings for acute diverticulitis. Electronically Signed   By: Rudie Meyer M.D.   On: 10/06/2023 18:31   DG Chest Port 1 View  Result Date: 10/06/2023 CLINICAL DATA:  Chest pain EXAM: PORTABLE CHEST 1 VIEW COMPARISON:  07/12/2023 FINDINGS: Heart size appears normal. There is no pleural fluid, interstitial edema or airspace disease. Visualized osseous structures appear intact. IMPRESSION: No acute cardiopulmonary disease. Electronically Signed   By: Signa Kell M.D.   On: 10/06/2023 17:09    Procedures Procedures    Medications Ordered in ED Medications - No data to display  ED Course/ Medical Decision Making/ A&P                                  Medical Decision Making 89 year old female with past medical history of DVT/PE on Eliquis as well as advanced dementia presenting to the emergency department today with chest pain as well as abdominal pain which seem to have improved if not resolved.  Given the patient's age I will further evaluate her here with basic labs Wels and EKG, chest x-ray, and troponin for further evaluation for ACS, pulmonary edema, pulmonary infiltrates, or pneumothorax.  This also evaluate for any right heart strain if there were to be a pulmonary embolism but she should have been receiving her medications at her facility so this should be relatively unlikely.  Will obtain noncontrast CT scan of her abdomen to evaluate for obstruction or other intra-abdominal pathology.  I will reevaluate for ultimate disposition.  The patient's labs are unremarkable.  CT scan does not show any concerning findings.  The patient remained stable here.  I think that she is stable for discharge.  Amount and/or Complexity of Data Reviewed Labs: ordered. Radiology: ordered.           Final Clinical Impression(s) / ED Diagnoses Final diagnoses:  Generalized abdominal pain    Rx / DC Orders ED Discharge Orders     None         Durwin Glaze, MD 10/06/23 [phone]

## 2023-10-06 NOTE — ED Notes (Signed)
Awaiting [facility] transport back to facility.

## 2023-10-06 NOTE — ED Notes (Signed)
Pt lying in recliner at nurses' station with eyes closed. Resp even and unlabored. No acute distress noted.

## 2023-10-06 NOTE — Discharge Instructions (Signed)
Your workup today was reassuring.  Please follow-up with your doctor.  Return to the ER for worsening symptoms.

## 2023-10-06 NOTE — ED Notes (Signed)
[facility] scheduled for the patient.

## 2023-10-06 NOTE — ED Triage Notes (Signed)
PT BIB EMS from memory care, [facility], for left upper chest pain that was reproducible with palpitation that has since resolved and now pt is reporting abdominal pain. HX dementia and escaped facility yesterday. Facility reported foul smelling urine and worse confusion than baseline.

## 2023-10-06 NOTE — ED Notes (Signed)
Pt to be transported back to [facility] via [facility]. Report given to [facility].

## 2023-10-06 NOTE — ED Notes (Signed)
Pt is a high fall risk. Will not stay in bed in room. Ambulating throughout department. Confused. When try to redirect, pt becomes agitated. Hx of dementia. Placed at nurses' station with staff. Pt sitting in a chair.

## 2023-10-06 NOTE — ED Notes (Signed)
Pt wandering within nurses' station. Provided pt with snack and beverage. Chair switched out for recliner to assist with keeping pt sitting and fall prevention. Provided with two warm blankets.

## 2023-12-05 ENCOUNTER — Emergency Department (HOSPITAL_COMMUNITY): Payer: Medicare PPO

## 2023-12-05 ENCOUNTER — Emergency Department (HOSPITAL_COMMUNITY)
Admission: EM | Admit: 2023-12-05 | Discharge: 2023-12-06 | Disposition: A | Discharge status: Home / Self Care | Payer: Medicare PPO | Attending: Emergency Medicine | Admitting: Emergency Medicine

## 2023-12-05 ENCOUNTER — Encounter (HOSPITAL_COMMUNITY): Payer: Self-pay

## 2023-12-05 DIAGNOSIS — S0083XA Contusion of other part of head, initial encounter: Secondary | ICD-10-CM | POA: Diagnosis not present

## 2023-12-05 DIAGNOSIS — M542 Cervicalgia: Secondary | ICD-10-CM | POA: Insufficient documentation

## 2023-12-05 DIAGNOSIS — Z7901 Long term (current) use of anticoagulants: Secondary | ICD-10-CM | POA: Diagnosis not present

## 2023-12-05 DIAGNOSIS — W19XXXA Unspecified fall, initial encounter: Secondary | ICD-10-CM | POA: Insufficient documentation

## 2023-12-05 DIAGNOSIS — S0990XA Unspecified injury of head, initial encounter: Secondary | ICD-10-CM | POA: Diagnosis present

## 2023-12-05 DIAGNOSIS — I10 Essential (primary) hypertension: Secondary | ICD-10-CM | POA: Diagnosis not present

## 2023-12-05 DIAGNOSIS — Z79899 Other long term (current) drug therapy: Secondary | ICD-10-CM | POA: Insufficient documentation

## 2023-12-05 DIAGNOSIS — F0390 Unspecified dementia without behavioral disturbance: Secondary | ICD-10-CM | POA: Insufficient documentation

## 2023-12-05 DIAGNOSIS — Y92239 Unspecified place in hospital as the place of occurrence of the external cause: Secondary | ICD-10-CM | POA: Insufficient documentation

## 2023-12-05 LAB — CBC
HCT: 35.6 % — ABNORMAL LOW (ref 36.0–46.0)
Hemoglobin: 11.5 g/dL — ABNORMAL LOW (ref 12.0–15.0)
MCH: 31.9 pg (ref 26.0–34.0)
MCHC: 32.3 g/dL (ref 30.0–36.0)
MCV: 98.6 fL (ref 80.0–100.0)
Platelets: 185 10*3/uL (ref 150–400)
RBC: 3.61 MIL/uL — ABNORMAL LOW (ref 3.87–5.11)
RDW: 13.3 % (ref 11.5–15.5)
WBC: 5.2 10*3/uL (ref 4.0–10.5)
nRBC: 0 % (ref 0.0–0.2)

## 2023-12-05 LAB — I-STAT CG4 LACTIC ACID, ED: Lactic Acid, Venous: 0.8 mmol/L (ref 0.5–1.9)

## 2023-12-05 LAB — ETHANOL: Alcohol, Ethyl (B): <10 mg/dL (ref <10)

## 2023-12-05 LAB — COMPREHENSIVE METABOLIC PANEL
ALT: 21 U/L (ref 0–44)
AST: 26 U/L (ref 15–41)
Albumin: 3.3 g/dL — ABNORMAL LOW (ref 3.5–5.0)
Alkaline Phosphatase: 47 U/L (ref 38–126)
Anion gap: 7 (ref 5–15)
BUN: 26 mg/dL — ABNORMAL HIGH (ref 8–23)
CO2: 21 mmol/L — ABNORMAL LOW (ref 22–32)
Calcium: 9.1 mg/dL (ref 8.9–10.3)
Chloride: 110 mmol/L (ref 98–111)
Creatinine, Ser: 1.4 mg/dL — ABNORMAL HIGH (ref 0.44–1.00)
GFR, Estimated: 35 mL/min — ABNORMAL LOW (ref >60)
Glucose, Bld: 89 mg/dL (ref 70–99)
Potassium: 4.3 mmol/L (ref 3.5–5.1)
Sodium: 138 mmol/L (ref 135–145)
Total Bilirubin: 0.4 mg/dL (ref <1.2)
Total Protein: 5.8 g/dL — ABNORMAL LOW (ref 6.5–8.1)

## 2023-12-05 LAB — PROTIME-INR
INR: 1.3 — ABNORMAL HIGH (ref 0.8–1.2)
Prothrombin Time: 16.3 s — ABNORMAL HIGH (ref 11.4–15.2)

## 2023-12-05 LAB — SAMPLE TO BLOOD BANK

## 2023-12-05 NOTE — ED Provider Notes (Signed)
Diane Robertson AT [facility] Provider Note   CSN: [id number] Arrival date & time: 12/05/23  2250     History  Chief Complaint  Patient presents with   Diane Robertson    Diane Robertson is a 89 y.o. female who presents the emergency Robertson from nursing facility after unwitnessed fall.  Per staff, patient ambulated out to the nurses station to tell them that she had fallen, but did not recall details of the incident.  They noted a large hematoma to her right forehead.  Patient was complaining of neck pain, arrives in a c-collar.  History of dementia, ANO x 3, which is her baseline.  She is on Eliquis.  She tested positive for COVID-19 on 12/25.  Level 5 caveat due to dementia  HPI     Home Medications Prior to Admission medications   Medication Sig Start Date End Date Taking? Authorizing Provider  acetaminophen (TYLENOL) 325 MG tablet Take 2 tablets (650 mg total) by mouth every 6 (six) hours as needed for fever or mild pain. 06/02/23   Celine Mans, MD  apixaban (ELIQUIS) 5 MG TABS tablet Take 1 tablet (5 mg total) by mouth 2 (two) times daily. 06/06/22   Joseph Art, DO  apixaban (ELIQUIS) 5 MG TABS tablet Take 1 tablet (5 mg total) by mouth 2 (two) times daily. 06/02/23   Celine Mans, MD  atenolol (TENORMIN) 25 MG tablet Take 25 mg by mouth daily.    [provider]  azithromycin (ZITHROMAX) 250 MG tablet Take 250 mg by mouth as directed. 06/18/23   [provider]  benzonatate (TESSALON) 100 MG capsule Take 100 mg by mouth 3 (three) times daily. 06/25/23   [provider]  cephALEXin (KEFLEX) 500 MG capsule Take 500 mg by mouth 3 (three) times daily. 06/04/23   [provider]  cetirizine (ZYRTEC) 10 MG tablet Take 10 mg by mouth daily as needed for allergies.    [provider]  co-enzyme Q-10 30 MG capsule Take 30 mg by mouth daily.    [provider]  famotidine (PEPCID) 20 MG tablet Take 20 mg  by mouth 2 (two) times daily.    [provider]  ferrous sulfate 325 (65 FE) MG tablet Take 1 tablet (325 mg total) by mouth daily with breakfast. 06/03/23   Celine Mans, MD  GERI-TUSSIN 100 MG/5ML liquid Take 10 mLs by mouth every 6 (six) hours. 06/05/23   [provider]  ipratropium (ATROVENT) 0.03 % nasal spray Place 2 sprays into both nostrils every 12 (twelve) hours as needed for rhinitis.    [provider]  lactose free nutrition (BOOST PLUS) LIQD Take 237 mLs by mouth 3 (three) times daily with meals. 06/06/22   Joseph Art, DO  LORazepam (ATIVAN) 0.5 MG tablet Take 1 tablet (0.5 mg total) by mouth 2 (two) times daily. 06/02/23   Celine Mans, MD  megestrol (MEGACE) 40 MG tablet Take 40 mg by mouth 2 (two) times daily. 06/17/23   [provider]  mirtazapine (REMERON) 7.5 MG tablet Take 1 tablet (7.5 mg total) by mouth at bedtime. 06/02/23   Celine Mans, MD  Multiple Vitamin (MULTIVITAMIN WITH MINERALS) TABS tablet Take 1 tablet by mouth daily. 06/07/22   Joseph Art, DO  pantoprazole (PROTONIX) 40 MG tablet Take 1 tablet (40 mg total) by mouth 2 (two) times daily. 06/06/22   Joseph Art, DO  saccharomyces boulardii (FLORASTOR) 250 MG capsule Take 250  mg by mouth 2 (two) times daily.    [provider]  sertraline (ZOLOFT) 50 MG tablet Take 1 tablet (50 mg total) by mouth daily. 06/03/23   Celine Mans, MD      Allergies    Amoxicillin, Ciprofloxacin, Codeine, Glucose, and Bee venom    Review of Systems   Review of Systems  Unable to perform ROS: Dementia  Musculoskeletal:  Positive for neck pain.    Physical Exam Updated Vital Signs BP (!) 162/80 (BP Location: Right Arm)   Pulse 62   Temp 98 F (36.7 C) (Oral)   Resp 16   SpO2 100%  Physical Exam Vitals and nursing note reviewed.  Constitutional:      Appearance: Normal appearance.  HENT:     Head: Normocephalic.      Comments: Large hematoma to the  right forehead (2.5 cm)  Eyes:     General: Lids are normal.     Extraocular Movements: Extraocular movements intact.     Conjunctiva/sclera: Conjunctivae normal.     Pupils: Pupils are equal, round, and reactive to light.  Neck:     Comments: In C-collar Cardiovascular:     Rate and Rhythm: Normal rate and regular rhythm.     Pulses:          Radial pulses are 2+ on the right side and 2+ on the left side.       Posterior tibial pulses are 2+ on the right side and 2+ on the left side.  Pulmonary:     Effort: Pulmonary effort is normal. No respiratory distress.     Breath sounds: Normal breath sounds.  Chest:     Comments: Chest wall stable, no tenderness Abdominal:     General: There is no distension.     Palpations: Abdomen is soft.     Tenderness: There is no abdominal tenderness.  Musculoskeletal:     Comments: Pelvis stable, no tenderness. Extremities with soft compartments, no deformities, no tenderness.   Skin:    General: Skin is warm and dry.  Neurological:     General: No focal deficit present.     Mental Status: She is alert. Mental status is at baseline.     Comments: 5/5 grip strength bilaterally     ED Results / Procedures / Treatments   Labs (all labs ordered are listed, but only abnormal results are displayed) Labs Reviewed  COMPREHENSIVE METABOLIC PANEL - Abnormal; Notable for the following components:      Result Value   CO2 21 (*)    BUN 26 (*)    Creatinine, Ser 1.40 (*)    Total Protein 5.8 (*)    Albumin 3.3 (*)    GFR, Estimated 35 (*)    All other components within normal limits  CBC - Abnormal; Notable for the following components:   RBC 3.61 (*)    Hemoglobin 11.5 (*)    HCT 35.6 (*)    All other components within normal limits  PROTIME-INR - Abnormal; Notable for the following components:   Prothrombin Time 16.3 (*)    INR 1.3 (*)    All other components within normal limits  ETHANOL  URINALYSIS, ROUTINE W REFLEX MICROSCOPIC  I-STAT  CG4 LACTIC ACID, ED  SAMPLE TO BLOOD BANK    EKG None  Radiology CT Head Wo Contrast Result Date: 12/05/2023 CLINICAL DATA:  Head and neck trauma sustained in unwitnessed fall. Right forehead hematoma. Does not recall the incident. EXAM: CT HEAD  WITHOUT CONTRAST CT CERVICAL SPINE WITHOUT CONTRAST TECHNIQUE: Multidetector CT imaging of the head and cervical spine was performed following the standard protocol without intravenous contrast. Multiplanar CT image reconstructions of the cervical spine were also generated. RADIATION DOSE REDUCTION: This exam was performed according to the departmental dose-optimization program which includes automated exposure control, adjustment of the mA and/or kV according to patient size and/or use of iterative reconstruction technique. COMPARISON:  Head and cervical spine CT both 07/12/2023. FINDINGS: CT HEAD FINDINGS Brain: There is mild-to-moderate cerebral atrophy, small-vessel disease and atrophic ventriculomegaly with small chronic bilateral gangliocapsular lacunar infarcts. The cerebellum and brainstem are unremarkable. No cortical based acute infarct, hemorrhage, mass or mass effect is seen. There is no midline shift.  The basal cisterns are clear. Vascular: There are patchy calcifications in the carotid siphons but no hyperdense central vasculature. Skull: Right forehead scalp contusion. Negative for fractures or focal lesions. Sinuses/Orbits: Old lens replacements. Otherwise negative orbits. The mastoid air cells remain clear. Interval new finding of diffuse patchy opacification of the ethmoid air cells, increased membrane disease in the left frontal, both maxillary, and bilateral sphenoid sinus. No sinus fluid levels are seen.  Nasal septum is S shaped. Other: None. CT CERVICAL SPINE FINDINGS Alignment: Trace chronic grade 1 anterolisthesis C4-5 likely due to facet hypertrophy. No new or traumatic or further alignment abnormality. Right rotary positioning of the  atlas over the axis is consistent with the patient's head position. There is no C1-2 lateral mass offset. Skull base and vertebrae: No acute fracture is evident. No primary bone lesion or focal pathologic process. There is osteopenia. Soft tissues and spinal canal: No prevertebral fluid or swelling. No visible canal hematoma. There is no laryngeal mass. Mild calcific plaque both carotid bifurcations. Disc levels: Chronic disc space loss C5-6 and C6-7 with small bidirectional osteophytes. No spondylotic cord compression or disc herniations. Other discs are normal in heights. There is uncinate joint and facet hypertrophy at most levels with multilevel acquired foraminal stenosis, worst at C4-5 on the right and bilaterally at C5-6. Narrowing and spurring of the anterior atlantodental joint appears similar. Upper chest: Negative. Other: None. IMPRESSION: 1. No acute intracranial CT findings or depressed skull fractures. 2. Right forehead scalp contusion. 3. Atrophy, small-vessel disease and chronic lacunar infarcts. 4. Interval new finding multifocal sinus membrane disease, worst in the ethmoids. 5. Osteopenia and degenerative change without evidence of cervical fractures. 6. Carotid atherosclerosis. Electronically Signed   By: Almira Bar M.D.   On: 12/05/2023 23:48   CT Cervical Spine Wo Contrast Result Date: 12/05/2023 CLINICAL DATA:  Head and neck trauma sustained in unwitnessed fall. Right forehead hematoma. Does not recall the incident. EXAM: CT HEAD WITHOUT CONTRAST CT CERVICAL SPINE WITHOUT CONTRAST TECHNIQUE: Multidetector CT imaging of the head and cervical spine was performed following the standard protocol without intravenous contrast. Multiplanar CT image reconstructions of the cervical spine were also generated. RADIATION DOSE REDUCTION: This exam was performed according to the departmental dose-optimization program which includes automated exposure control, adjustment of the mA and/or kV according  to patient size and/or use of iterative reconstruction technique. COMPARISON:  Head and cervical spine CT both 07/12/2023. FINDINGS: CT HEAD FINDINGS Brain: There is mild-to-moderate cerebral atrophy, small-vessel disease and atrophic ventriculomegaly with small chronic bilateral gangliocapsular lacunar infarcts. The cerebellum and brainstem are unremarkable. No cortical based acute infarct, hemorrhage, mass or mass effect is seen. There is no midline shift.  The basal cisterns are clear. Vascular: There are patchy calcifications in  the carotid siphons but no hyperdense central vasculature. Skull: Right forehead scalp contusion. Negative for fractures or focal lesions. Sinuses/Orbits: Old lens replacements. Otherwise negative orbits. The mastoid air cells remain clear. Interval new finding of diffuse patchy opacification of the ethmoid air cells, increased membrane disease in the left frontal, both maxillary, and bilateral sphenoid sinus. No sinus fluid levels are seen.  Nasal septum is S shaped. Other: None. CT CERVICAL SPINE FINDINGS Alignment: Trace chronic grade 1 anterolisthesis C4-5 likely due to facet hypertrophy. No new or traumatic or further alignment abnormality. Right rotary positioning of the atlas over the axis is consistent with the patient's head position. There is no C1-2 lateral mass offset. Skull base and vertebrae: No acute fracture is evident. No primary bone lesion or focal pathologic process. There is osteopenia. Soft tissues and spinal canal: No prevertebral fluid or swelling. No visible canal hematoma. There is no laryngeal mass. Mild calcific plaque both carotid bifurcations. Disc levels: Chronic disc space loss C5-6 and C6-7 with small bidirectional osteophytes. No spondylotic cord compression or disc herniations. Other discs are normal in heights. There is uncinate joint and facet hypertrophy at most levels with multilevel acquired foraminal stenosis, worst at C4-5 on the right and  bilaterally at C5-6. Narrowing and spurring of the anterior atlantodental joint appears similar. Upper chest: Negative. Other: None. IMPRESSION: 1. No acute intracranial CT findings or depressed skull fractures. 2. Right forehead scalp contusion. 3. Atrophy, small-vessel disease and chronic lacunar infarcts. 4. Interval new finding multifocal sinus membrane disease, worst in the ethmoids. 5. Osteopenia and degenerative change without evidence of cervical fractures. 6. Carotid atherosclerosis. Electronically Signed   By: Almira Bar M.D.   On: 12/05/2023 23:48   DG Pelvis Portable Result Date: 12/05/2023 CLINICAL DATA:  Recent fall with pelvic pain, initial encounter EXAM: PORTABLE PELVIS 1 VIEWS COMPARISON:  07/12/2023 FINDINGS: Right hip replacement is again noted. Pelvic ring appears intact. No acute fracture or dislocation is noted. No soft tissue changes are seen. IMPRESSION: No acute abnormality noted. Electronically Signed   By: Alcide Clever M.D.   On: 12/05/2023 23:29   DG Chest Portable 1 View Result Date: 12/05/2023 CLINICAL DATA:  Recent fall with chest pain, initial encounter EXAM: PORTABLE CHEST 1 VIEW COMPARISON:  10/06/23 FINDINGS: Cardiac shadow is within normal limits. Lungs are well aerated bilaterally. No focal infiltrate or effusion is seen. No acute bony abnormality is noted. IMPRESSION: No active disease. Electronically Signed   By: Alcide Clever M.D.   On: 12/05/2023 23:28    Procedures Procedures    Medications Ordered in ED Medications - No data to display  ED Course/ Medical Decision Making/ A&P                                 Medical Decision Making Amount and/or Complexity of Data Reviewed Labs: ordered. Radiology: ordered.  This patient is a 89 y.o. female who presents to the ED for concern of unwitnessed fall on blood thinners, this involves an extensive number of treatment options, and is a complaint that carries with it a high risk of complications and  morbidity. The emergent differential diagnosis prior to evaluation includes, but is not limited to,  fractures, dislocations, internal bleeding, intracranial bleeding. This is not an exhaustive differential.   Past Medical History / Co-morbidities / Social History: DVT/PE on Eliquis, hypertension, dementia  Physical Exam: Physical exam performed. The pertinent findings include: Hyper,  otherwise normal vital signs.  No acute distress.  Large hematoma noted to the right forehead.  In c-collar.  Chest wall and pelvis stable, extremities with soft compartments and no deformities or tenderness.  Neurovascular intact in all extremities.  Lab Tests: I ordered, and personally interpreted labs.  The pertinent results include: CBC and CMP at baseline.  Normal lactic acid. INR 1.3.    Imaging Studies: I ordered imaging studies including CT head, CT cervical spine, CXR and pelvis XR.. I independently visualized and interpreted imaging which showed no acute traumatic findings. I agree with the radiologist interpretation.   Disposition: After consideration of the diagnostic results and the patients response to treatment, I feel that emergency Robertson workup does not suggest an emergent condition requiring admission or immediate intervention beyond what has been performed at this time. The plan is: discharge back to facility. Labs and imaging reassuring. Benign exam. The patient is safe for discharge and has been instructed to return immediately for worsening symptoms, change in symptoms or any other concerns. Family notified.   I discussed this case with my attending physician Dr. Pilar Plate who cosigned this note including patient's presenting symptoms, physical exam, and planned diagnostics and interventions. Attending physician stated agreement with plan or made changes to plan which were implemented.    Final Clinical Impression(s) / ED Diagnoses Final diagnoses:  Fall, initial encounter  Traumatic hematoma  of forehead, initial encounter    Rx / DC Orders ED Discharge Orders     None      Portions of this report may have been transcribed using voice recognition software. Every effort was made to ensure accuracy; however, inadvertent computerized transcription errors may be present.    Su Monks, PA-C 12/06/23 0005    Sabas Sous, MD 12/06/23 [phone]

## 2023-12-05 NOTE — ED Notes (Addendum)
Trauma Response Nurse Documentation   Diane Robertson is a 89 y.o. female arriving to [facility] ED via EMS  On Eliquis (apixaban) daily. Trauma was activated as a Level 2 by ED charge RN based on the following trauma criteria Elderly patients > 65 with head trauma on anti-coagulation (excluding ASA).  Patient cleared for CT by Dr. Lynelle Doctor EDP. Pt transported to CT with trauma response nurse present to monitor. RN remained with the patient throughout their absence from the department for clinical observation.   GCS 14.   History   Past Medical History:  Diagnosis Date   Diverticulitis    DVT (deep venous thrombosis) (HCC)    Hypertension    Pulmonary embolism (HCC)      Past Surgical History:  Procedure Laterality Date   BOWEL RESECTION N/A 05/25/2022   Procedure: SMALL BOWEL RESECTION WITH DIVERTICULA;  Surgeon: Gaynelle Adu, MD;  Location: [facility] OR;  Service: General;  Laterality: N/A;   EYE SURGERY     Bilateral implants   LAPAROTOMY N/A 05/25/2022   Procedure: EXPLORATORY LAPAROTOMY;  Surgeon: Gaynelle Adu, MD;  Location: [facility] OR;  Service: General;  Laterality: N/A;   TONSILLECTOMY         Initial Focused Assessment (If applicable, or please see trauma documentation): Confused female presents via EMS from SNF after an unwitnessed fall with obvious head trauma, hematoma to right forehead and right knee. C/o pain from c-collar, changed to [facility] J  Airway patent/unobstructed, BS clear No obvious uncontrolled hemorrhage GCS 14, alert to self and situation, unclear baseline from EMS report PERRLA 2 CT's Completed:   CT Head and CT C-Spine   Interventions:  IV start and trauma lab draw [place] J collar Portable chest and pelvis XRAY CT head and cervical spine Plan for disposition:  D/C  Consults completed:    Event Summary: Arrives via EMS from SNF. Covid positive. Right head and right knee bruising noted. Pt confused to events.  MTP Summary (If applicable):   Bedside  handoff with ED RN .    Diane Robertson  Trauma Response RN  Please call TRN at [phone] for further assistance.

## 2023-12-05 NOTE — ED Triage Notes (Addendum)
Patient via [place] [facility] from [facility] c/o unwitnessed fall. Patient ambulated out to nurses station @facility  to tell them she had fallen, but she does not recall the incident.Hematoma to right forehead; reports neck pain;  c-collar in place.   Hx dementia - aox3; patient is on eliquis  COVID+ on 12/04/23 Bgl 105 VSS

## 2023-12-05 NOTE — Progress Notes (Signed)
Orthopedic Tech Progress Note Patient Details:  ANICE GANNETT Mar 24, 1929 [id number]  Responded to level 2 trauma, not needed at this time  Patient ID: Danton Clap, female   DOB: [birth year], 89 y.o.   MRN: [id number]  Diannia Ruder 12/05/2023, 11:46 PM

## 2023-12-06 NOTE — Discharge Instructions (Signed)
Diane Robertson was seen in the ER after a fall.  Her lab work and imaging was reassuring.  We do not see any evidence of broken bones or internal bleeding.  We notified her family of her ER visit.

## 2023-12-21 ENCOUNTER — Other Ambulatory Visit: Payer: Self-pay

## 2023-12-21 ENCOUNTER — Encounter (HOSPITAL_COMMUNITY): Payer: Self-pay | Admitting: Emergency Medicine

## 2023-12-21 ENCOUNTER — Emergency Department (HOSPITAL_COMMUNITY)
Admission: EM | Admit: 2023-12-21 | Discharge: 2023-12-21 | Disposition: A | Discharge status: Home / Self Care | Payer: Medicare PPO | Attending: Emergency Medicine | Admitting: Emergency Medicine

## 2023-12-21 ENCOUNTER — Emergency Department (HOSPITAL_COMMUNITY): Payer: Medicare PPO

## 2023-12-21 DIAGNOSIS — Z86718 Personal history of other venous thrombosis and embolism: Secondary | ICD-10-CM | POA: Insufficient documentation

## 2023-12-21 DIAGNOSIS — W19XXXA Unspecified fall, initial encounter: Secondary | ICD-10-CM | POA: Insufficient documentation

## 2023-12-21 DIAGNOSIS — Z7901 Long term (current) use of anticoagulants: Secondary | ICD-10-CM | POA: Diagnosis not present

## 2023-12-21 DIAGNOSIS — Y92129 Unspecified place in nursing home as the place of occurrence of the external cause: Secondary | ICD-10-CM | POA: Diagnosis not present

## 2023-12-21 DIAGNOSIS — Z86711 Personal history of pulmonary embolism: Secondary | ICD-10-CM | POA: Insufficient documentation

## 2023-12-21 DIAGNOSIS — S61412A Laceration without foreign body of left hand, initial encounter: Secondary | ICD-10-CM | POA: Insufficient documentation

## 2023-12-21 DIAGNOSIS — S6992XA Unspecified injury of left wrist, hand and finger(s), initial encounter: Secondary | ICD-10-CM | POA: Diagnosis present

## 2023-12-21 MED ORDER — LIDOCAINE HCL (PF) 1 % IJ SOLN
5.0000 mL | Freq: Once | INTRAMUSCULAR | Status: AC
  Administered 2023-12-21: 5 mL via INTRADERMAL
  Filled 2023-12-21: qty 30

## 2023-12-21 NOTE — ED Triage Notes (Signed)
 Pt BIBA coming from [facility] for unwitnessed fall, pt has laceration to left hand. A&O to self only. Denies pain. VSS

## 2023-12-21 NOTE — ED Notes (Signed)
 [facility] called for transport.
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
## 2023-12-21 NOTE — Discharge Instructions (Addendum)
 Sutures will need to be removed in about 1 week.  Can change dressings to skin tear left wrist as needed. Follow-up with primary care. Return here for new concerns.

## 2023-12-21 NOTE — ED Notes (Addendum)
 Spoke with [facility], Legal guardian, notified of discharge back to facility.

## 2023-12-21 NOTE — ED Notes (Signed)
 Report called to [facility] senior [facility], handoff given to [employer].

## 2024-02-29 ENCOUNTER — Emergency Department (HOSPITAL_COMMUNITY)

## 2024-02-29 ENCOUNTER — Emergency Department (HOSPITAL_COMMUNITY)
Admission: EM | Admit: 2024-02-29 | Discharge: 2024-03-01 | Disposition: A | Discharge status: Home / Self Care | Attending: Emergency Medicine | Admitting: Emergency Medicine

## 2024-02-29 ENCOUNTER — Encounter (HOSPITAL_COMMUNITY): Payer: Self-pay

## 2024-02-29 ENCOUNTER — Other Ambulatory Visit: Payer: Self-pay

## 2024-02-29 DIAGNOSIS — S0990XA Unspecified injury of head, initial encounter: Secondary | ICD-10-CM | POA: Diagnosis present

## 2024-02-29 DIAGNOSIS — Z7901 Long term (current) use of anticoagulants: Secondary | ICD-10-CM | POA: Diagnosis not present

## 2024-02-29 DIAGNOSIS — I10 Essential (primary) hypertension: Secondary | ICD-10-CM | POA: Insufficient documentation

## 2024-02-29 DIAGNOSIS — S51812A Laceration without foreign body of left forearm, initial encounter: Secondary | ICD-10-CM | POA: Diagnosis not present

## 2024-02-29 DIAGNOSIS — F0390 Unspecified dementia without behavioral disturbance: Secondary | ICD-10-CM | POA: Diagnosis not present

## 2024-02-29 DIAGNOSIS — S0083XA Contusion of other part of head, initial encounter: Secondary | ICD-10-CM | POA: Diagnosis not present

## 2024-02-29 LAB — CBC WITH DIFFERENTIAL/PLATELET
Abs Immature Granulocytes: 0.01 10*3/uL (ref 0.00–0.07)
Basophils Absolute: 0.1 10*3/uL (ref 0.0–0.1)
Basophils Relative: 1 %
Eosinophils Absolute: 0.1 10*3/uL (ref 0.0–0.5)
Eosinophils Relative: 1 %
HCT: 33.9 % — ABNORMAL LOW (ref 36.0–46.0)
Hemoglobin: 11.2 g/dL — ABNORMAL LOW (ref 12.0–15.0)
Immature Granulocytes: 0 %
Lymphocytes Relative: 29 %
Lymphs Abs: 1.6 10*3/uL (ref 0.7–4.0)
MCH: 31.8 pg (ref 26.0–34.0)
MCHC: 33 g/dL (ref 30.0–36.0)
MCV: 96.3 fL (ref 80.0–100.0)
Monocytes Absolute: 0.7 10*3/uL (ref 0.1–1.0)
Monocytes Relative: 13 %
Neutro Abs: 3.1 10*3/uL (ref 1.7–7.7)
Neutrophils Relative %: 56 %
Platelets: 191 10*3/uL (ref 150–400)
RBC: 3.52 MIL/uL — ABNORMAL LOW (ref 3.87–5.11)
RDW: 13.6 % (ref 11.5–15.5)
WBC: 5.5 10*3/uL (ref 4.0–10.5)
nRBC: 0 % (ref 0.0–0.2)

## 2024-02-29 LAB — BASIC METABOLIC PANEL
Anion gap: 8 (ref 5–15)
BUN: 28 mg/dL — ABNORMAL HIGH (ref 8–23)
CO2: 21 mmol/L — ABNORMAL LOW (ref 22–32)
Calcium: 9.1 mg/dL (ref 8.9–10.3)
Chloride: 108 mmol/L (ref 98–111)
Creatinine, Ser: 1.44 mg/dL — ABNORMAL HIGH (ref 0.44–1.00)
GFR, Estimated: 34 mL/min — ABNORMAL LOW (ref >60)
Glucose, Bld: 126 mg/dL — ABNORMAL HIGH (ref 70–99)
Potassium: 3.3 mmol/L — ABNORMAL LOW (ref 3.5–5.1)
Sodium: 137 mmol/L (ref 135–145)
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
## 2024-03-01 NOTE — ED Notes (Signed)
 Patient transported to [facility] via [place]. All belongings sent with patient. Facility is aware patient returning.

## 2024-06-07 IMAGING — CT CT CTA ABD/PEL W/CM AND/OR W/O CM
3 of 9 series · 10 of 46 positions shown, 16 images · IV contrast (APPLIED)
Comparison: None Available.

CLINICAL DATA: Mesenteric ischemia, postprandial abdominal pain,
diverticulitis

EXAM:
CTA ABDOMEN AND PELVIS WITHOUT AND WITH CONTRAST
TECHNIQUE: Multidetector CT imaging of the abdomen and pelvis was performed
using the standard protocol during bolus administration of
intravenous contrast. Multiplanar reconstructed images and MIPs were
obtained and reviewed to evaluate the vascular anatomy.

[Series 4: arterial (person_name) · axial · arterial · 0.76mm/px · z∈[+894,+944]mm · 2 of 230 slices shown]
[im 26/230  soft-tissue]
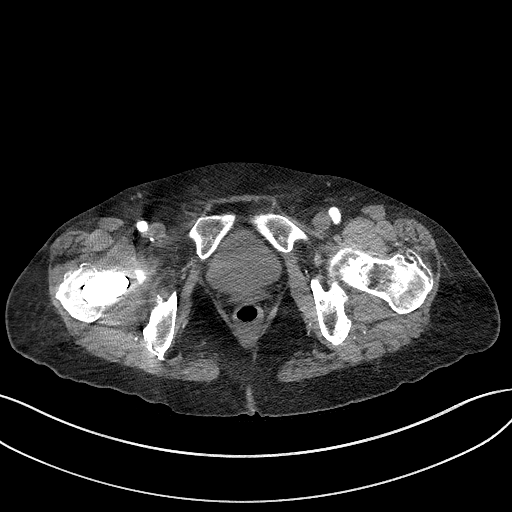
[im 51/230  soft-tissue]
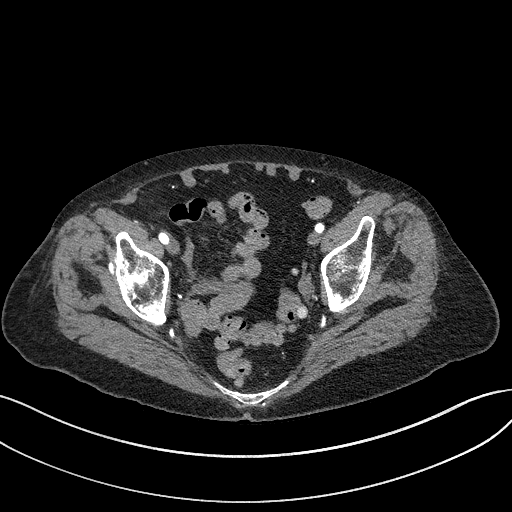

[Series 7: cor art · coronal · 0.74mm/px · 2 of 119 slices shown, 3 images]
[im 40/119  soft-tissue]
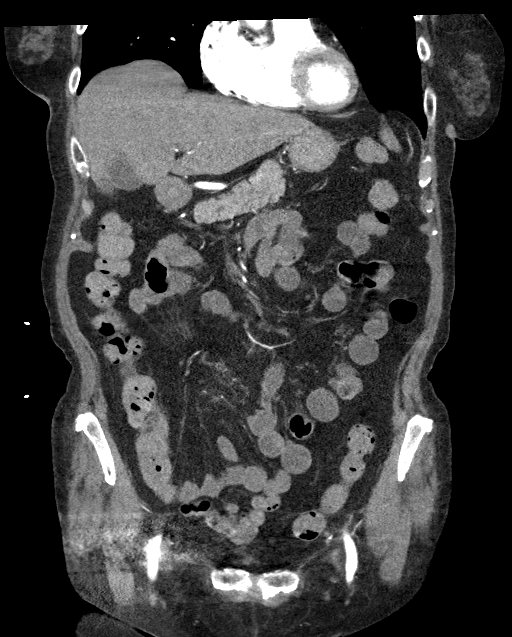
[im 40/119  bone]
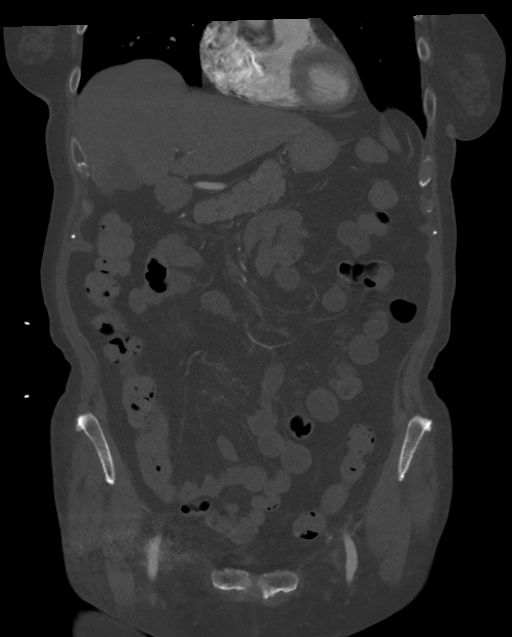
[im 79/119  soft-tissue]
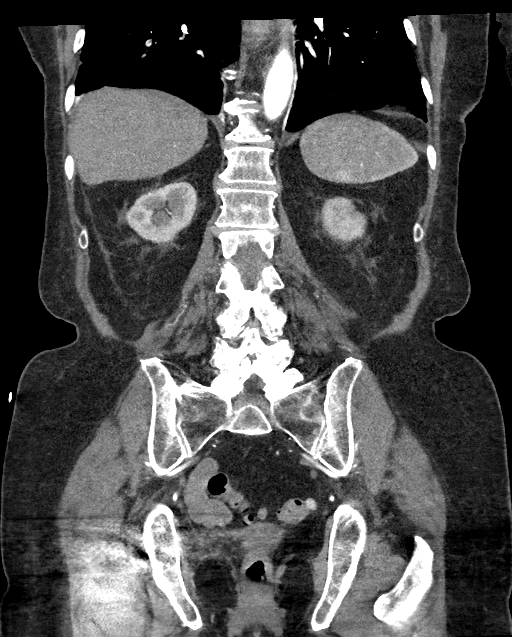

[Series 11: portal venous (person_name) · axial · portal-venous · 0.73mm/px · z∈[+912,+1237]mm · 6 of 92 slices shown, 11 images]
[im 14/92  soft-tissue]
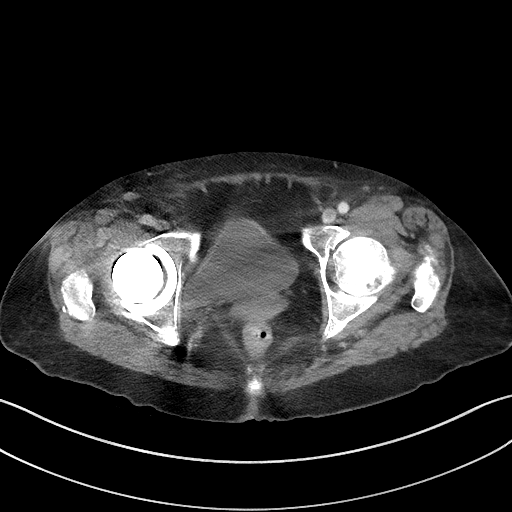
[im 14/92  bone]
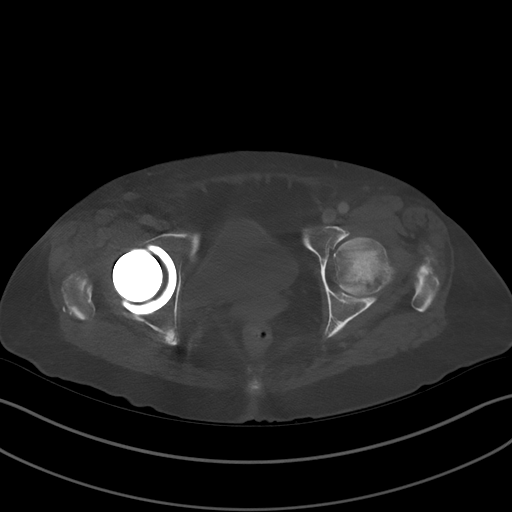
[im 27/92  soft-tissue]
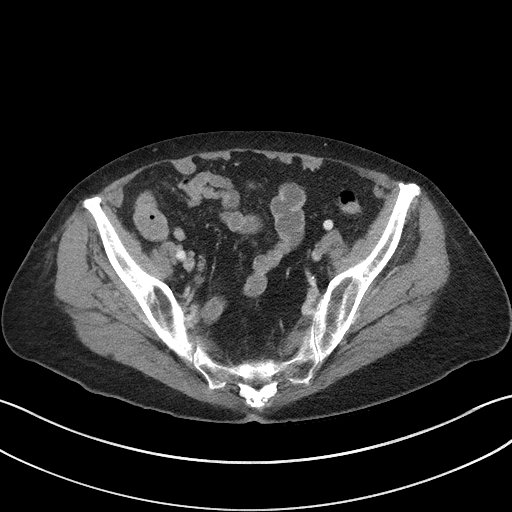
[im 40/92  soft-tissue]
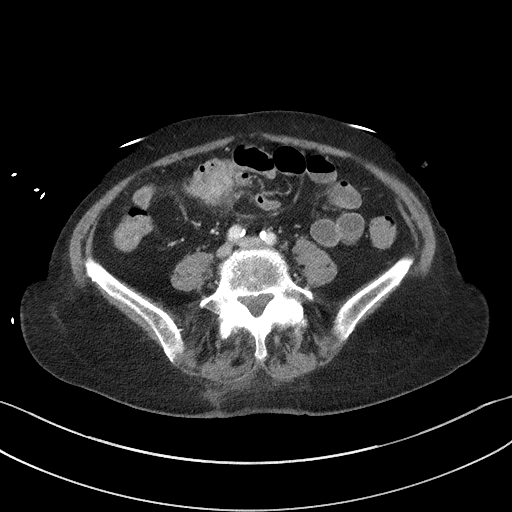
[im 40/92  lung]
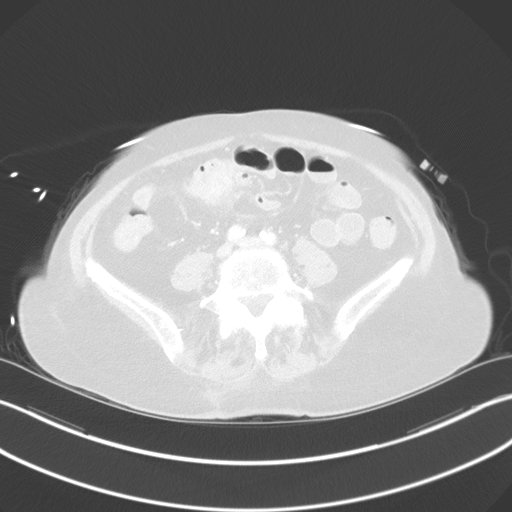
[im 53/92  soft-tissue]
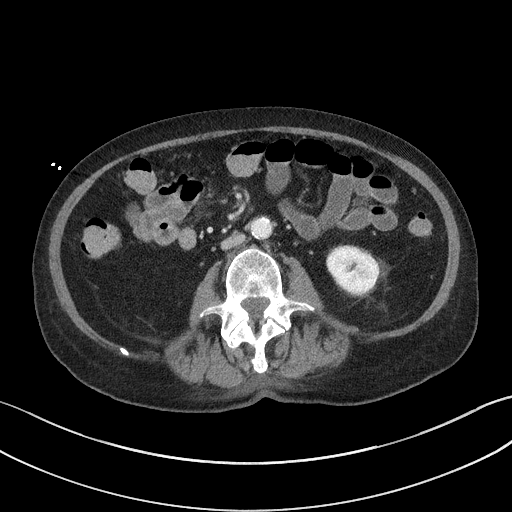
[im 53/92  lung]
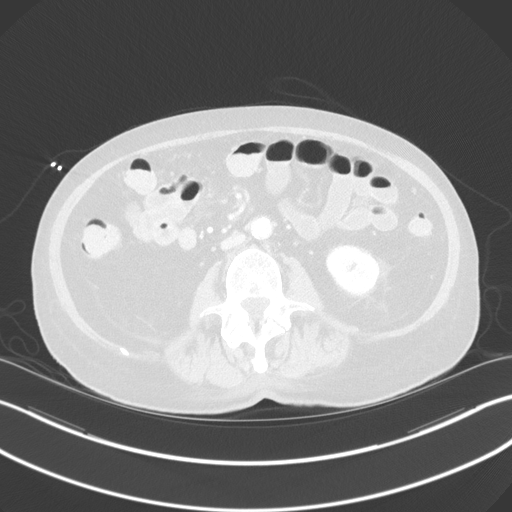
[im 66/92  soft-tissue]
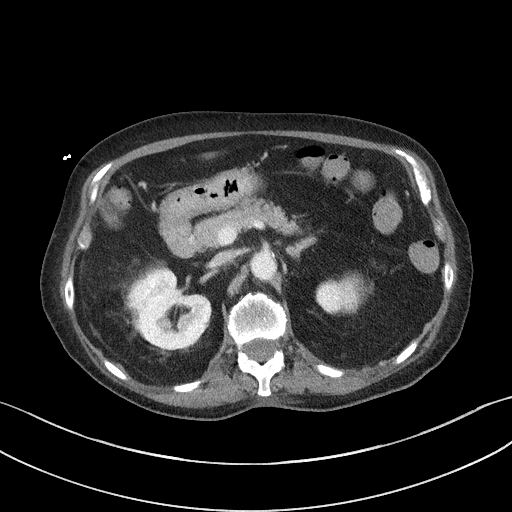
[im 66/92  lung]
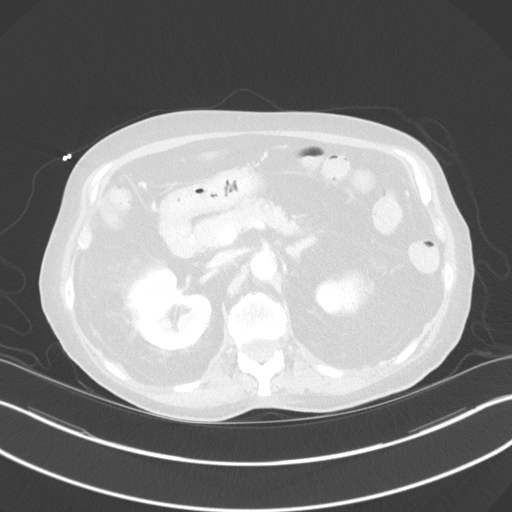
[im 79/92  soft-tissue]
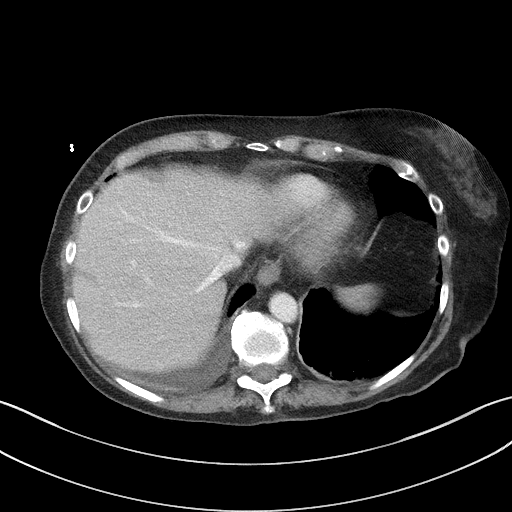
[im 79/92  lung]
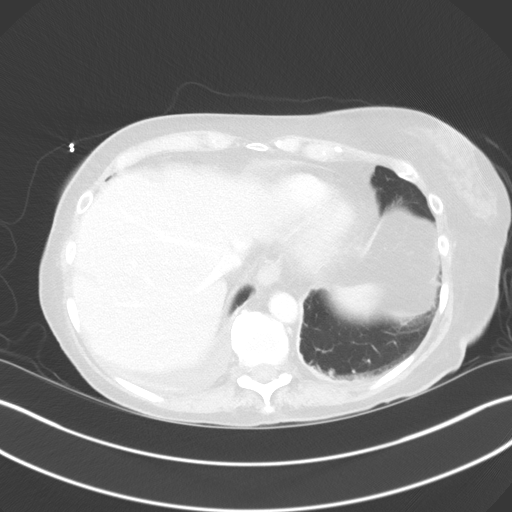

[10 of 46 positions shown; findings below may reference images not displayed]

RADIATION DOSE REDUCTION: This exam was performed according to the
departmental dose-optimization program which includes automated
exposure control, adjustment of the mA and/or kV according to
patient size and/or use of iterative reconstruction technique.

CONTRAST:  75mL OMNIPAQUE IOHEXOL 350 MG/ML SOLN
FINDINGS: VASCULAR

Aorta: Mild mixed atherosclerotic plaque. No hemodynamically
significant stenosis. No aneurysm or dissection. No periaortic
inflammatory change.

Celiac: Widely patent.  No aneurysm or dissection.

SMA: Less than 50% stenosis at its origin. Distally patent without
evidence of aneurysm or dissection.

Renals: No evidence of hemodynamically significant stenosis,
aneurysm or dissection. Normal vascular morphology.

IMA: Less than 50% stenosis of the origin.  Distally widely patent.

Inflow: Patent without evidence of aneurysm, dissection, vasculitis
or significant stenosis.

Proximal Outflow: Bilateral common femoral and visualized portions
of the superficial and profunda femoral arteries are patent without
evidence of aneurysm, dissection, vasculitis or significant
stenosis.

Veins: Unremarkable

Review of the MIP images confirms the above findings.

NON-VASCULAR

Lower chest: There are incidentally noted segmental and subsegmental
acute pulmonary emboli within the visualized right lower lobe.
Ground-glass pulmonary infiltrate within the distribution of emboli
within the right costophrenic angle may represent hemorrhagic
exudate in the setting of pulmonary infarction. Small right pleural
effusion is present. Cardiac size within normal limits. No CT
evidence of right heart strain.

Hepatobiliary: Simple cyst within the right hepatic lobe. Liver
otherwise unremarkable. Gallbladder unremarkable. No intra or
extrahepatic biliary ductal dilation.

Pancreas: Unremarkable

Spleen: Unremarkable

Adrenals/Urinary Tract: The adrenal glands are unremarkable. Kidneys
are normal in size and position. Simple cortical cyst noted within
the lower pole the right kidney. No follow-up imaging is recommended
for this lesion. Mild bilateral nonspecific perinephric stranding.
No hydronephrosis. No intrarenal or ureteral calculi. The bladder is
unremarkable.

Stomach/Bowel: Severe sigmoid diverticulosis. No superimposed
evidence of acute diverticulitis. There is in infiltrative soft
tissue mass in abutting the lumen of the distal small bowel and
eccentrically with a punctate focus of extraluminal gas along the
mesenteric border of the bowel lumen best seen on coronal image #
29/13 and axial image # 51/11. This mass measures 2.8 x 3.2 cm in
size. This may represent an inflammatory collection in the setting
of small bowel diverticulitis. However, a neoplastic mass is not
excluded. No discrete drainable fluid component is identified. There
is no obstruction. No free intraperitoneal gas or fluid. The stomach
and small bowel are otherwise unremarkable. Appendix normal.

Lymphatic: Aortic atherosclerosis. No enlarged abdominal or pelvic
lymph nodes.

Reproductive: Uterus and bilateral adnexa are unremarkable.

Other: Tiny fat containing umbilical hernia. Rectum is unremarkable.

Musculoskeletal: Right total hip arthroplasty has been performed.
Degenerative changes are seen within the lumbar spine. No acute bone
abnormality. No lytic or blastic bone lesion.
IMPRESSION: 1. Acute segmental and subsegmental pulmonary emboli within the
visualized right lower lobe. Ground-glass pulmonary infiltrate
within the distribution of emboli within the right costophrenic
angle may represent hemorrhagic exudate in the setting of pulmonary
infarction. Small right pleural effusion. No CT evidence of right
heart strain.
2. Focal inflammatory mass with punctate extraluminal focus of gas
within the distal small bowel eccentrically along the mesenteric
border possibly representing an area of small-bowel diverticulitis.
A infiltrative small-bowel neoplasm, however, is not excluded.
Follow-up evaluation following conservative therapy or comparison
with prior examinations, if available, would be helpful in
differentiating these entities.
3. Severe sigmoid diverticulosis. No superimposed evidence of acute
diverticulitis.
4.  Aortic Atherosclerosis ([id number]).

## 2024-06-13 IMAGING — DX DG ABD PORTABLE 1V
1 series · 1 of 1 positions shown · non-contrast
Comparison: Chest radiograph 05/23/2022

CLINICAL DATA: NG tube placement

EXAM:
PORTABLE ABDOMEN - 1 VIEW

[abdomen]
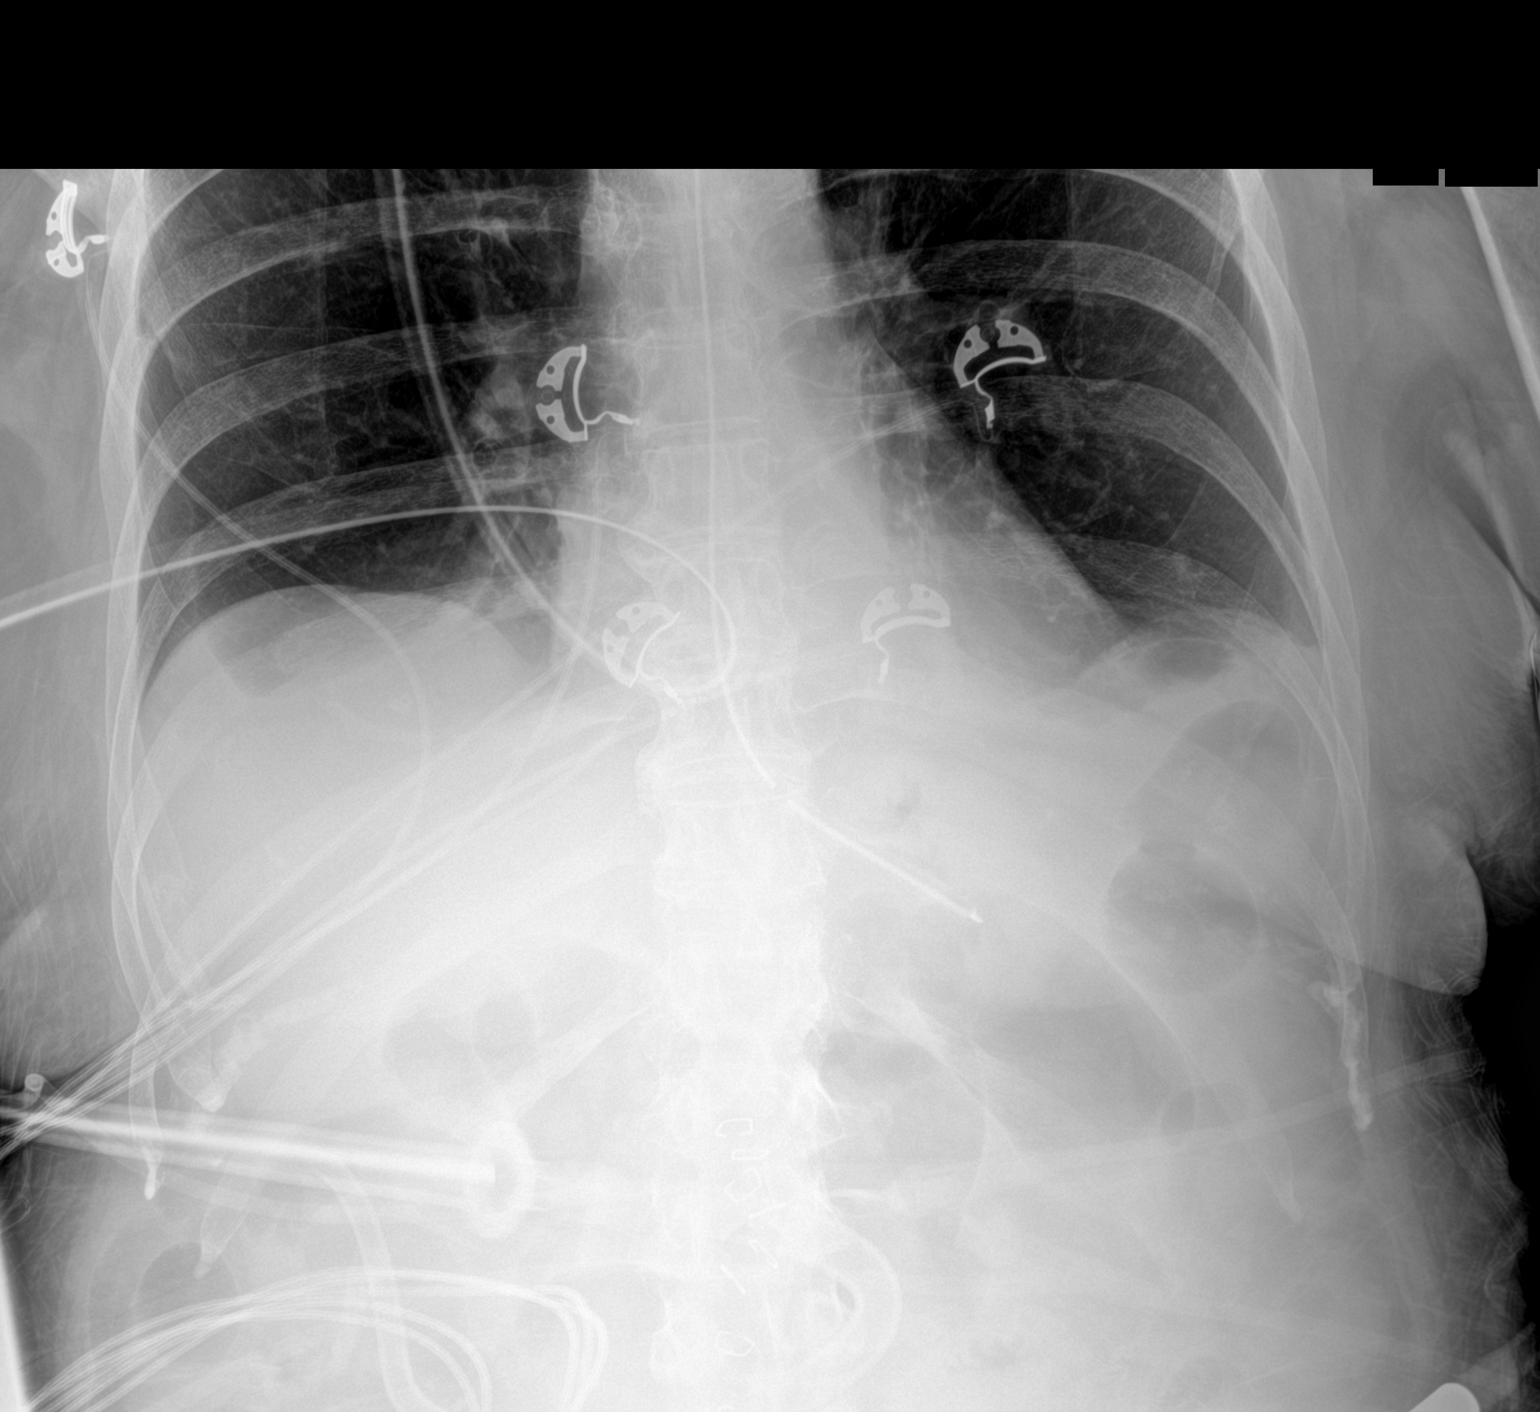

[1 of 1 positions shown; findings below may reference images not displayed]

FINDINGS: Interval placement of an enteric tube. Tip is in the left upper
quadrant consistent with location in the upper stomach with proximal
side hole projected at or just below the level of the EG junction.
Advancement of 2-4 cm is suggested for more central placement in the
stomach. Shallow inspiration with linear atelectasis in the lung
bases. Probable small pleural effusions. Mild gaseous distention of
bowel. Postoperative changes with skin clips along the midline and
presumed drainage catheter in the upper abdomen.
IMPRESSION: Enteric tube tip projects over the upper stomach with proximal side
hole at or just below the level of the EG junction. Advancement is
suggested for better placement more centrally in the stomach.
